# Patient Record
Sex: Male | Born: 1975 | Race: Black or African American | Hispanic: No | Marital: Single | Smoking: Current some day smoker
Health system: Southern US, Community
[De-identification: ages and names within clinical notes are randomized; demographics above are authoritative.]

## PROBLEM LIST (undated history)

## (undated) DIAGNOSIS — I10 Essential (primary) hypertension: Secondary | ICD-10-CM

## (undated) DIAGNOSIS — K429 Umbilical hernia without obstruction or gangrene: Secondary | ICD-10-CM

## (undated) DIAGNOSIS — K219 Gastro-esophageal reflux disease without esophagitis: Secondary | ICD-10-CM

## (undated) DIAGNOSIS — E119 Type 2 diabetes mellitus without complications: Secondary | ICD-10-CM

## (undated) DIAGNOSIS — M722 Plantar fascial fibromatosis: Secondary | ICD-10-CM

## (undated) DIAGNOSIS — M1990 Unspecified osteoarthritis, unspecified site: Secondary | ICD-10-CM

## (undated) HISTORY — PX: TESTICLE SURGERY: SHX794

---

## 2018-06-07 ENCOUNTER — Other Ambulatory Visit: Payer: Self-pay

## 2018-06-07 ENCOUNTER — Emergency Department (HOSPITAL_BASED_OUTPATIENT_CLINIC_OR_DEPARTMENT_OTHER): Payer: BLUE CROSS/BLUE SHIELD

## 2018-06-07 ENCOUNTER — Emergency Department (HOSPITAL_BASED_OUTPATIENT_CLINIC_OR_DEPARTMENT_OTHER)
Admission: EM | Admit: 2018-06-07 | Discharge: 2018-06-07 | Disposition: A | Discharge status: Home / Self Care | Payer: BLUE CROSS/BLUE SHIELD | Attending: Emergency Medicine | Admitting: Emergency Medicine

## 2018-06-07 ENCOUNTER — Encounter (HOSPITAL_BASED_OUTPATIENT_CLINIC_OR_DEPARTMENT_OTHER): Payer: Self-pay | Admitting: Emergency Medicine

## 2018-06-07 DIAGNOSIS — J029 Acute pharyngitis, unspecified: Secondary | ICD-10-CM | POA: Diagnosis present

## 2018-06-07 DIAGNOSIS — F17200 Nicotine dependence, unspecified, uncomplicated: Secondary | ICD-10-CM | POA: Insufficient documentation

## 2018-06-07 DIAGNOSIS — K1120 Sialoadenitis, unspecified: Secondary | ICD-10-CM | POA: Diagnosis not present

## 2018-06-07 LAB — BASIC METABOLIC PANEL
Anion gap: 5 (ref 5–15)
BUN: 13 mg/dL (ref 6–20)
CO2: 25 mmol/L (ref 22–32)
Calcium: 9 mg/dL (ref 8.9–10.3)
Chloride: 106 mmol/L (ref 98–111)
Creatinine, Ser: 0.95 mg/dL (ref 0.61–1.24)
GFR calc Af Amer: >60 mL/min (ref >60)
GFR calc non Af Amer: >60 mL/min (ref >60)
Glucose, Bld: 108 mg/dL — ABNORMAL HIGH (ref 70–99)
Potassium: 4 mmol/L (ref 3.5–5.1)
Sodium: 136 mmol/L (ref 135–145)

## 2018-06-07 LAB — CBC WITH DIFFERENTIAL/PLATELET
Abs Immature Granulocytes: 0.03 10*3/uL (ref 0.00–0.07)
Basophils Absolute: 0 10*3/uL (ref 0.0–0.1)
Basophils Relative: 1 %
Eosinophils Absolute: 0.1 10*3/uL (ref 0.0–0.5)
Eosinophils Relative: 2 %
HCT: 47 % (ref 39.0–52.0)
Hemoglobin: 15.8 g/dL (ref 13.0–17.0)
Immature Granulocytes: 0 %
Lymphocytes Relative: 21 %
Lymphs Abs: 1.7 10*3/uL (ref 0.7–4.0)
MCH: 30 pg (ref 26.0–34.0)
MCHC: 33.6 g/dL (ref 30.0–36.0)
MCV: 89.4 fL (ref 80.0–100.0)
Monocytes Absolute: 0.5 10*3/uL (ref 0.1–1.0)
Monocytes Relative: 6 %
Neutro Abs: 5.7 10*3/uL (ref 1.7–7.7)
Neutrophils Relative %: 70 %
Platelets: 242 10*3/uL (ref 150–400)
RBC: 5.26 MIL/uL (ref 4.22–5.81)
RDW: 11.9 % (ref 11.5–15.5)
WBC: 8.1 10*3/uL (ref 4.0–10.5)
nRBC: 0 % (ref 0.0–0.2)

## 2018-06-07 LAB — GROUP A STREP BY PCR: Group A Strep by PCR: NOT DETECTED

## 2018-06-07 MED ORDER — AMOXICILLIN 500 MG PO CAPS: 500.0000 mg | ORAL_CAPSULE | Freq: Two times a day (BID) | ORAL | 0 refills | Status: AC

## 2018-06-07 MED ORDER — IOHEXOL 300 MG/ML  SOLN
100.0000 mL | Freq: Once | INTRAMUSCULAR | Status: AC | PRN
  Administered 2018-06-07: 14:00:00 75 mL via INTRAVENOUS

## 2018-06-07 NOTE — ED Triage Notes (Signed)
Pt c/o left sided throat pain onset Friday morning. Pt reports that he feels a lump in that area.

## 2018-06-07 NOTE — ED Provider Notes (Signed)
[facility] EMERGENCY DEPARTMENT Provider Note   CSN: [id number] Arrival date & time: 06/07/18  1131  History   Chief Complaint Chief Complaint  Patient presents with   Sore Throat   HPI Benjamin Barton is a 43 y.o. male with no signifiant past medical history who presents for evaluation of neck pain and sore throat. Symptoms began on Friday 2 days PTA.  Taken anything for symptoms.  He rates his pain a 3/10.  Pain does not radiate.  Pain located to left lateral neck.  States he has no pain with swallowing liquids, however states it is painful to swallow solid foods.  Has fever, chills, nausea, vomiting, chest pain, neck stiffness, neck rigidity, injuries or trauma, congestion, rhinorrhea, drooling, dysphasia, trismus.  Denies history of full bolus or esophageal strictures, hx abscesses.  Denies additional aggravating or alleviating factors.  He has not take anything for his pain PTA.  History obtained from patient.  No interpreter was used.     HPI  History reviewed. No pertinent past medical history.  There are no active problems to display for this patient.   Past Surgical History:  Procedure Laterality Date   TESTICLE SURGERY          Home Medications    Prior to Admission medications   Medication Sig Start Date End Date Taking? Authorizing Provider  amoxicillin (AMOXIL) 500 MG capsule Take 1 capsule (500 mg total) by mouth 2 (two) times daily for 5 days. 06/07/18 06/12/18  Yaviel Kloster A, PA-C    Family History No family history on file.  Social History Social History   Tobacco Use   Smoking status: Current Some Day Smoker   Smokeless tobacco: Never Used  Substance Use Topics   Alcohol use: Not Currently   Drug use: Not Currently     Allergies   Patient has no known allergies.   Review of Systems Review of Systems  Constitutional: Negative.   HENT: Positive for sore throat. Negative for congestion, dental problem, drooling, ear  discharge, ear pain, facial swelling, mouth sores, nosebleeds, postnasal drip, rhinorrhea, sinus pressure, sinus pain, sneezing, tinnitus, trouble swallowing and voice change.   Respiratory: Negative.   Cardiovascular: Negative.   Gastrointestinal: Negative.   Genitourinary: Negative.   Musculoskeletal: Positive for neck pain. Negative for arthralgias, back pain, gait problem, joint swelling, myalgias and neck stiffness.  Skin: Negative.   Neurological: Negative.   All other systems reviewed and are negative.  Physical Exam Updated Vital Signs BP 125/85    Pulse 79    Temp 98.3 F (36.8 C)    Resp 18    Ht 6\' 3"  (1.905 m)    Wt 117.9 kg    SpO2 99%    BMI 32.50 kg/m   Physical Exam Vitals signs and nursing note reviewed.  Constitutional:      General: He is not in acute distress.    Appearance: He is well-developed. He is not ill-appearing, toxic-appearing or diaphoretic.  HENT:     Head: Normocephalic and atraumatic.     Nose: Nose normal. No congestion or rhinorrhea.     Mouth/Throat:     Comments: No drooling, dysphasia or trismus.  Sublingual area soft.  No pooling of secretions.  Does have diffuse erythematous posterior oropharynx.  Tonsils without edema or exudate.  No evidence of PTA or RPA. Uvula midline without deviation. Eyes:     Pupils: Pupils are equal, round, and reactive to light.  Neck:  Musculoskeletal: Normal range of motion and neck supple.     Trachea: Phonation normal.      Comments: 1cm non tender nodule to the anterior proximal neck.  No submandibular swelling.  Phonation normal.  No neck stiffness or rigidity. Cardiovascular:     Rate and Rhythm: Normal rate and regular rhythm.     Pulses: Normal pulses.     Heart sounds: Normal heart sounds.  Pulmonary:     Effort: Pulmonary effort is normal. No respiratory distress.     Comments: Clear to auscultation by that wheeze, rhonchi or rales.  No phonation changes. Abdominal:     General: There is no  distension.     Palpations: Abdomen is soft.  Musculoskeletal: Normal range of motion.  Skin:    General: Skin is warm and dry.  Neurological:     Mental Status: He is alert.     ED Treatments / Results  Labs (all labs ordered are listed, but only abnormal results are displayed) Labs Reviewed  BASIC METABOLIC PANEL - Abnormal; Notable for the following components:      Result Value   Glucose, Bld 108 (*)    All other components within normal limits  GROUP A STREP BY PCR  CBC WITH DIFFERENTIAL/PLATELET    EKG None  Radiology Ct Soft Tissue Neck W Contrast  Result Date: 06/07/2018 CLINICAL DATA:  Sore throat, stridor.  Left submandibular pain EXAM: CT NECK WITH CONTRAST TECHNIQUE: Multidetector CT imaging of the neck was performed using the standard protocol following the bolus administration of intravenous contrast. CONTRAST:  60mL OMNIPAQUE IOHEXOL 300 MG/ML  SOLN COMPARISON:  None. FINDINGS: Pharynx and larynx: Normal. No mass or swelling. Salivary glands: 2 mm calculus in the floor of the mouth on the left compatible with a distal left submandibular duct calculus. Minimal ductal dilatation. Left submandibular gland has minimal surrounding edema. No mass lesion. Right submandibular gland normal.  Parotid normal bilaterally. Thyroid: Negative Lymph nodes: 12 mm level 2 lymph nodes bilaterally. Subcentimeter level 5 lymph nodes bilaterally. No pathologic nodes. Vascular: Normal vascular enhancement Limited intracranial: Negative Visualized orbits: Negative Mastoids and visualized paranasal sinuses: Mucous retention cyst left maxillary sinus. Mild mucosal edema right maxillary sinus. No air-fluid levels. Mastoid clear bilaterally. Skeleton: Mild degenerative change cervical spine without acute skeletal abnormality. Upper chest: Negative Other: None IMPRESSION: 2 mm calculus distal left submandibular duct. Mild inflammation left submandibular gland. Negative for epiglottitis or tonsillitis  These results were called by telephone at the time of interpretation on 06/07/2018 at 2:00 pm to Dr. Ralph Leyden , who verbally acknowledged these results. Electronically Signed   By: Marlan Palau M.D.   On: 06/07/2018 14:00    Procedures Procedures (including critical care time)  Medications Ordered in ED Medications  iohexol (OMNIPAQUE) 300 MG/ML solution 100 mL (75 mLs Intravenous Contrast Given 06/07/18 1338)     Initial Impression / Assessment and Plan / ED Course  I have reviewed the triage vital signs and the nursing notes.  Pertinent labs & imaging results that were available during my care of the patient were reviewed by me and considered in my medical decision making (see chart for details).  76 old male appears otherwise well presents for evaluation of neck lesions and sore throat.  Afebrile, nonseptic, non-ill-appearing.  1 cm mobile, nontender area to left anterior lateral neck just below submandibular area without submandibular swelling.  No overlying skin changes specifically no erythema or warmth. No evidence of induration or fluctuance.  No evidence of infectious process on exam.  Posterior oropharynx mildly erythematous without exudate. Uvula midline without deviation.  No drooling, dysphasia or trismus.  Sublingual area soft.  No submandibular swelling.  Low suspicion for Ludwig's angina.  No tenderness to dentition.  No evidence of periapical abscess.  No stridor. Lungs clear.  No tachycardia, tachypnea or hypoxia.  Question possible early deep space infection, RPA, early non-drainable abscess, duct stone or duct infection? Will plan for labs, strep, imaging and reevaluate. Patient does not want anything for pain at this time.  1430: CBC without leukocytosis, Metabolic without electrolyte, renal or liver abnormality, strep negative, CT scan with 2 mm calculus distal left submandibular duct. Mild inflammation left submandibular gland.  Low suspicion clinically for mumps.  Discussed CT with attending. Recommends sour candies and Abx. Patient able to tolerate p.o. intake without difficulty. Patient looks clinically well.  Requesting DC at this time. Patient hemodynamically stable and appropriate for DC at this time.  I discussed return precautions with patient.  Patient voiced understanding and is agreeable to follow-up.      Final Clinical Impressions(s) / ED Diagnoses   Final diagnoses:  Sialadenitis    ED Discharge Orders         Ordered    amoxicillin (AMOXIL) 500 MG capsule  2 times daily     06/07/18 1432           Ranika Mcniel A, PA-C 06/07/18 1530    Tegeler, Canary Brim, MD 06/07/18 [phone]

## 2018-06-07 NOTE — Discharge Instructions (Addendum)
You have a stone in 1 of the ducts in your throat.  I given you an antibiotic.  I would also suggest sour candies to help remove this.  Follow-up with PCP in 2 to 3 days for reevaluation.  If you develop new or worsening symptoms fevers return to the emergency department for evaluation.

## 2020-03-08 IMAGING — CT CT NECK WITH CONTRAST
3 of 4 series · 12 of 33 positions shown, 14 images · IV contrast (omnipaque)
Comparison: None.

CLINICAL DATA: Sore throat, stridor.  Left submandibular pain

EXAM:
CT NECK WITH CONTRAST
TECHNIQUE: Multidetector CT imaging of the neck was performed using the
standard protocol following the bolus administration of intravenous
contrast.
CONTRAST:  75mL OMNIPAQUE IOHEXOL 300 MG/ML  SOLN

[Series 6: sag neck · sagittal · 0.62mm/px · 5 of 127 slices shown, 6 images]
[im 43/127  bone]
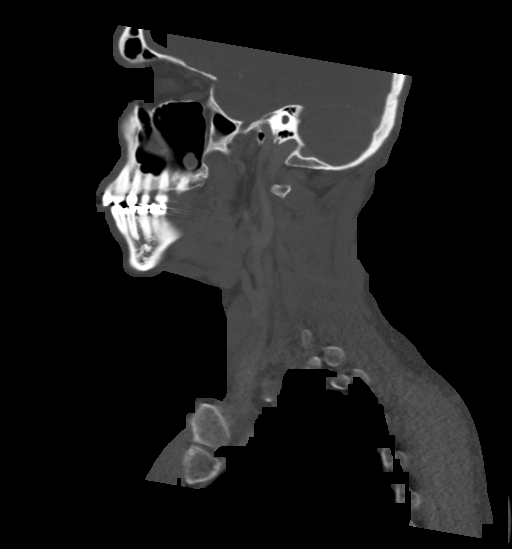
[im 53/127  bone]
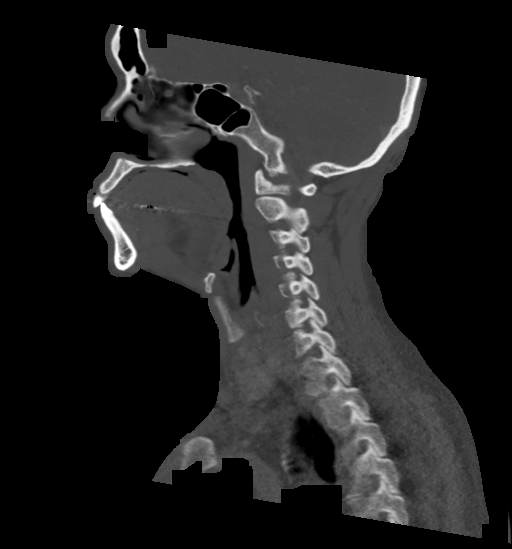
[im 64/127  soft-tissue]
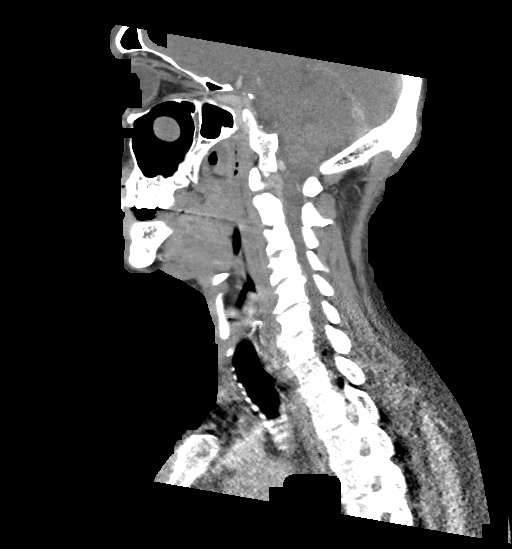
[im 64/127  bone]
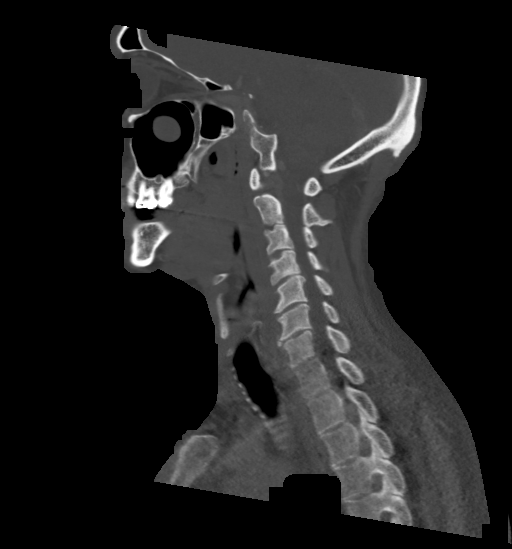
[im 74/127  bone]
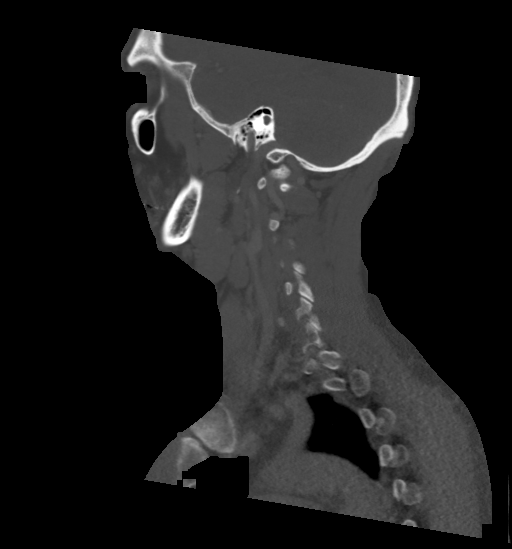
[im 85/127  bone]
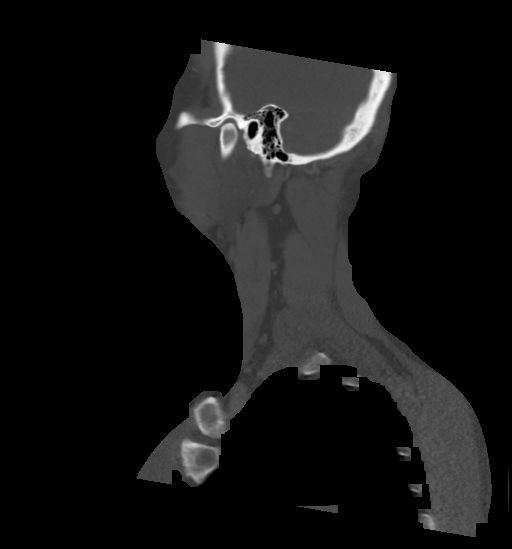

[Series 7: cor neck · coronal · 0.46mm/px · 3 of 115 slices shown]
[im 31/115  bone]
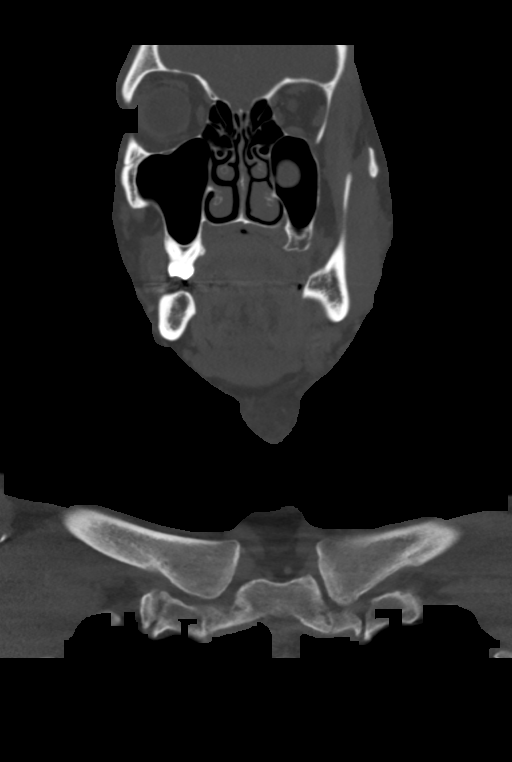
[im 49/115  bone]
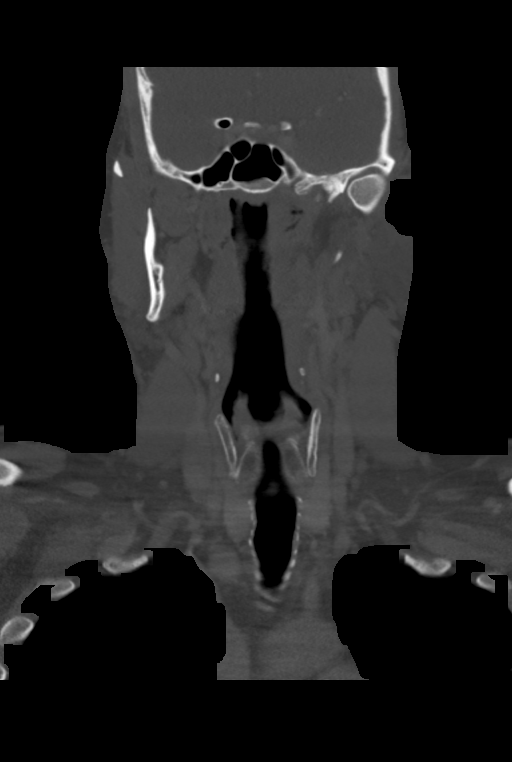
[im 67/115  bone]
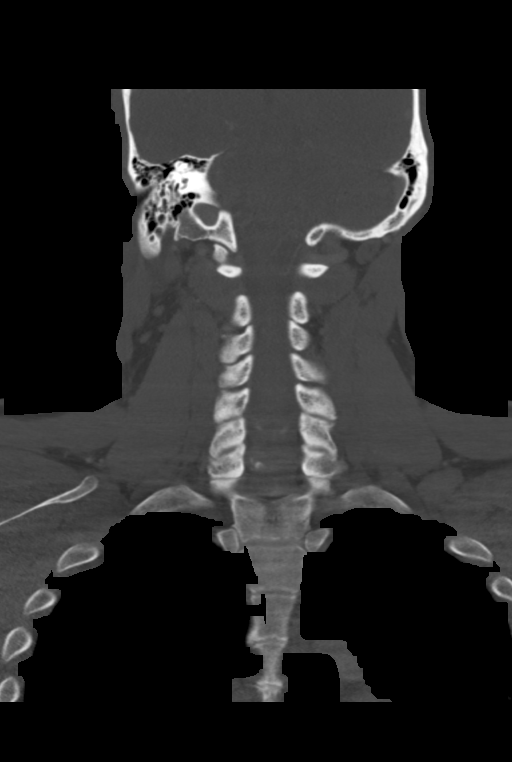

[Series 8: orthogonal ax · axial · 0.39mm/px · z∈[+614,+835]mm · 4 of 164 slices shown, 5 images]
[im 24/164  soft-tissue]
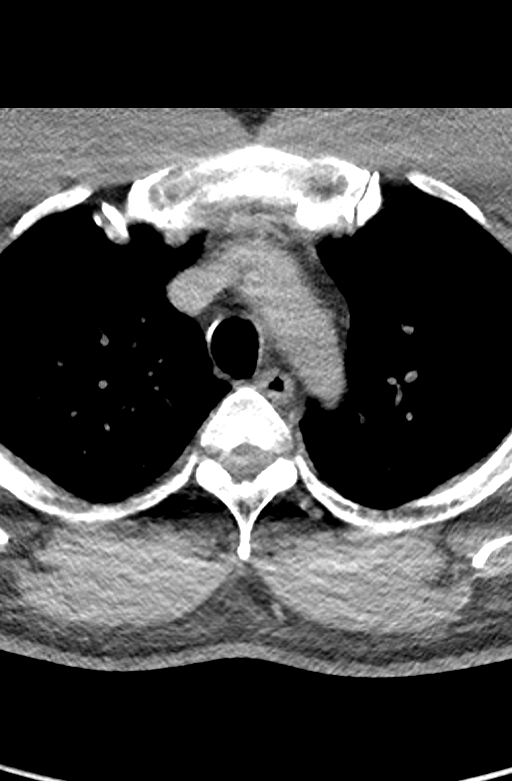
[im 24/164  bone]
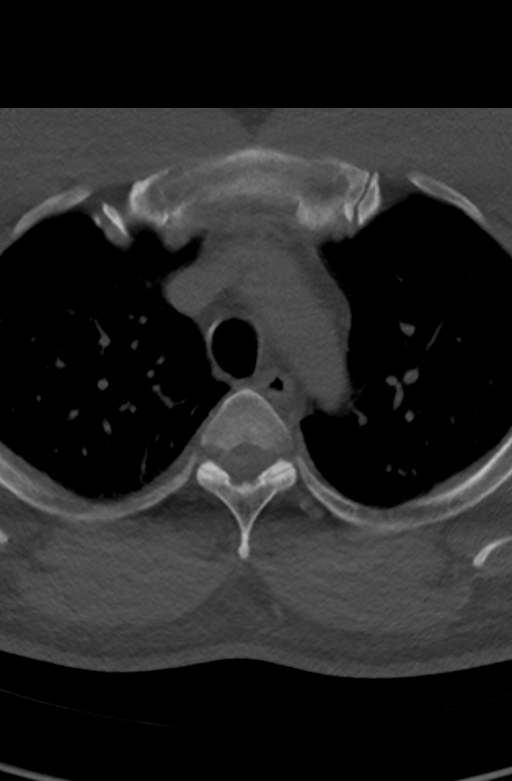
[im 70/164  bone]
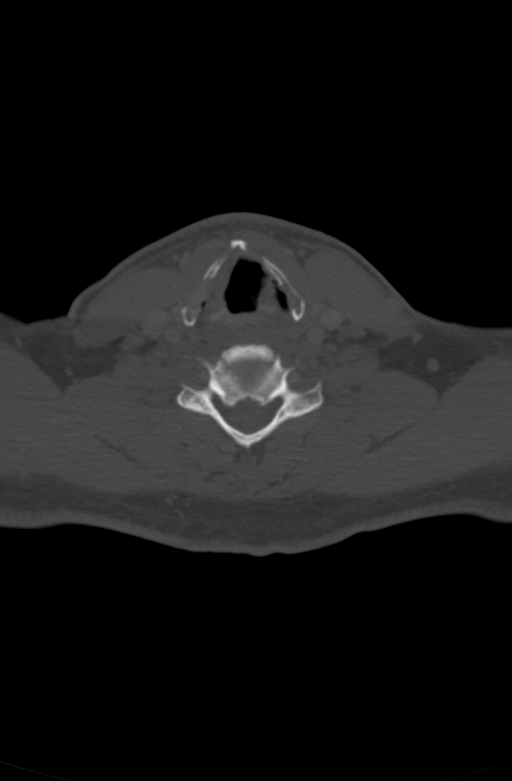
[im 94/164  bone]
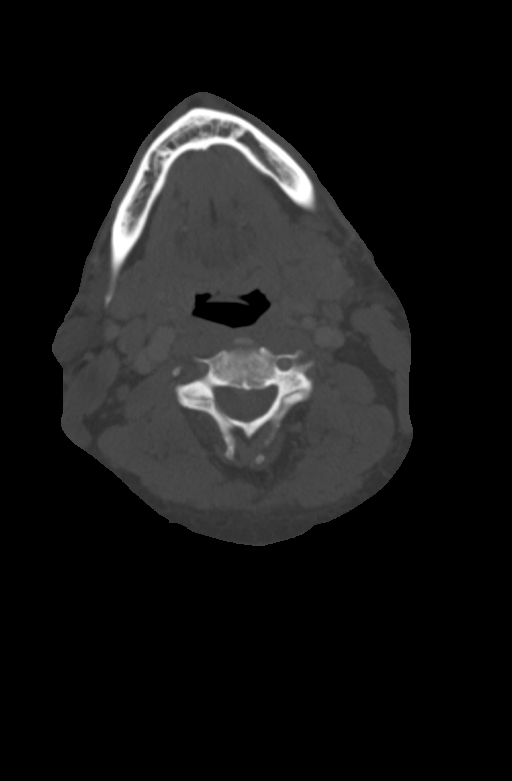
[im 140/164  bone]
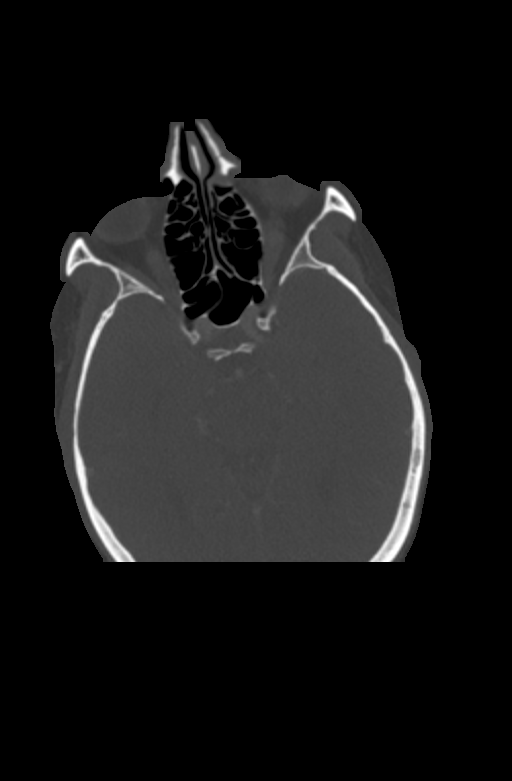

[12 of 33 positions shown; findings below may reference images not displayed]

FINDINGS: Pharynx and larynx: Normal. No mass or swelling.

Salivary glands: 2 mm calculus in the floor of the mouth on the left
compatible with a distal left submandibular duct calculus. Minimal
ductal dilatation. Left submandibular gland has minimal surrounding
edema. No mass lesion.

Right submandibular gland normal.  Parotid normal bilaterally.

Thyroid: Negative

Lymph nodes: 12 mm level 2 lymph nodes bilaterally. Subcentimeter
level 5 lymph nodes bilaterally. No pathologic nodes.

Vascular: Normal vascular enhancement

Limited intracranial: Negative

Visualized orbits: Negative

Mastoids and visualized paranasal sinuses: Mucous retention cyst
left maxillary sinus. Mild mucosal edema right maxillary sinus. No
air-fluid levels. Mastoid clear bilaterally.

Skeleton: Mild degenerative change cervical spine without acute
skeletal abnormality.

Upper chest: Negative

Other: None
IMPRESSION: 2 mm calculus distal left submandibular duct. Mild inflammation left
submandibular gland.

Negative for epiglottitis or tonsillitis

These results were called by telephone at the time of interpretation
on 06/07/2018 at [DATE] to Dr. SALLIE NORTHROP , who verbally
acknowledged these results.

## 2021-07-13 ENCOUNTER — Other Ambulatory Visit: Payer: Self-pay

## 2021-07-13 ENCOUNTER — Emergency Department (HOSPITAL_BASED_OUTPATIENT_CLINIC_OR_DEPARTMENT_OTHER)
Admission: EM | Admit: 2021-07-13 | Discharge: 2021-07-13 | Disposition: A | Discharge status: Home / Self Care | Payer: BC Managed Care – PPO | Attending: Emergency Medicine | Admitting: Emergency Medicine

## 2021-07-13 ENCOUNTER — Encounter (HOSPITAL_BASED_OUTPATIENT_CLINIC_OR_DEPARTMENT_OTHER): Payer: Self-pay

## 2021-07-13 DIAGNOSIS — M25561 Pain in right knee: Secondary | ICD-10-CM | POA: Diagnosis present

## 2021-07-13 HISTORY — DX: Plantar fascial fibromatosis: M72.2

## 2021-07-13 MED ORDER — KETOROLAC TROMETHAMINE 15 MG/ML IJ SOLN
15.0000 mg | Freq: Once | INTRAMUSCULAR | Status: AC
  Administered 2021-07-13: 15 mg via INTRAMUSCULAR
  Filled 2021-07-13: qty 1

## 2021-07-13 NOTE — Discharge Instructions (Signed)
Take 4 over the counter ibuprofen tablets 3 times a day or 2 over-the-counter naproxen tablets twice a day for pain. Also take tylenol 1000mg (2 extra strength) four times a day.    Follow-up with your family doctor in the office.  Hopefully after you have not bared weight on it for about a week it significantly better.  If not change then they may need to refer you to a specialist or obtain imaging.

## 2021-07-13 NOTE — ED Triage Notes (Addendum)
Pt reports right knee pain and swelling. Pain shoots down calf of leg No injury reported

## 2021-07-13 NOTE — ED Provider Notes (Signed)
[facility] EMERGENCY DEPARTMENT Provider Note   CSN: [id number] Arrival date & time: 07/13/21  0840     History  Chief Complaint  Patient presents with   Knee Pain    Benjamin Barton is a 46 y.o. male.  46 yo M with a chief complaint of right knee pain.  This been going on for about 3 to 4 days.  Denies obvious injury.  Feels like is a bit swollen.  Hurts along the tibia on the medial aspect just below the knee.  Denies fevers or chills.  Denies history of injury to that knee.   Knee Pain     Home Medications Prior to Admission medications   Not on File      Allergies    Patient has no known allergies.    Review of Systems   Review of Systems  Physical Exam Updated Vital Signs BP 121/83   Pulse 88   Temp 99.7 F (37.6 C)   Resp 18   Ht 6\' 3"  (1.905 m)   Wt 120.2 kg   SpO2 96%   BMI 33.12 kg/m  Physical Exam Vitals and nursing note reviewed.  Constitutional:      Appearance: He is well-developed.  HENT:     Head: Normocephalic and atraumatic.  Eyes:     Pupils: Pupils are equal, round, and reactive to light.  Neck:     Vascular: No JVD.  Cardiovascular:     Rate and Rhythm: Normal rate and regular rhythm.     Heart sounds: No murmur heard.   No friction rub. No gallop.  Pulmonary:     Effort: No respiratory distress.     Breath sounds: No wheezing.  Abdominal:     General: There is no distension.     Tenderness: There is no abdominal tenderness. There is no guarding or rebound.  Musculoskeletal:        General: Normal range of motion.     Cervical back: Normal range of motion and neck supple.     Comments: 2 old scars to the knee.  No obvious effusion.  Ligaments appear to be intact.  McMurray's test negative.  Pain mostly at the medial attachment of the hamstring to the tibia.  Pulse motor and sensation intact distally.  Skin:    Coloration: Skin is not pale.     Findings: No rash.  Neurological:     Mental Status: He is alert  and oriented to person, place, and time.  Psychiatric:        Behavior: Behavior normal.    ED Results / Procedures / Treatments   Labs (all labs ordered are listed, but only abnormal results are displayed) Labs Reviewed - No data to display  EKG None  Radiology No results found.  Procedures Procedures    Medications Ordered in ED Medications  ketorolac (TORADOL) 15 MG/ML injection 15 mg (15 mg Intramuscular Given 07/13/21 07/15/21)    ED Course/ Medical Decision Making/ A&P                           Medical Decision Making Risk Prescription drug management.   46 yo M with a chief complaints of right knee pain.  Going on for about 4 days now.  No obvious injury.  No erythema warmth or edema.  Pain mostly at the attachment of the hamstring to the tibia.  Suspect hamstring strain.  Will immobilize.  PCP follow-up.  9:20  AM:  I have discussed the diagnosis/risks/treatment options with the patient.  Evaluation and diagnostic testing in the emergency department does not suggest an emergent condition requiring admission or immediate intervention beyond what has been performed at this time.  They will follow up with  PCP. We also discussed returning to the ED immediately if new or worsening sx occur. We discussed the sx which are most concerning (e.g., sudden worsening pain, fever, inability to tolerate by mouth) that necessitate immediate return. Medications administered to the patient during their visit and any new prescriptions provided to the patient are listed below.  Medications given during this visit Medications  ketorolac (TORADOL) 15 MG/ML injection 15 mg (15 mg Intramuscular Given 07/13/21 4081)     The patient appears reasonably screen and/or stabilized for discharge and I doubt any other medical condition or other [facility] requiring further screening, evaluation, or treatment in the ED at this time prior to discharge.          Final Clinical Impression(s) / ED  Diagnoses Final diagnoses:  Acute pain of right knee    Rx / DC Orders ED Discharge Orders     None         Melene Plan, DO 07/13/21 0920

## 2022-03-12 ENCOUNTER — Emergency Department (HOSPITAL_BASED_OUTPATIENT_CLINIC_OR_DEPARTMENT_OTHER)
Admission: EM | Admit: 2022-03-12 | Discharge: 2022-03-12 | Disposition: A | Discharge status: Home / Self Care | Payer: BC Managed Care – PPO | Attending: Emergency Medicine | Admitting: Emergency Medicine

## 2022-03-12 ENCOUNTER — Other Ambulatory Visit: Payer: Self-pay

## 2022-03-12 ENCOUNTER — Encounter (HOSPITAL_BASED_OUTPATIENT_CLINIC_OR_DEPARTMENT_OTHER): Payer: Self-pay

## 2022-03-12 DIAGNOSIS — R0981 Nasal congestion: Secondary | ICD-10-CM | POA: Diagnosis present

## 2022-03-12 DIAGNOSIS — J029 Acute pharyngitis, unspecified: Secondary | ICD-10-CM | POA: Insufficient documentation

## 2022-03-12 DIAGNOSIS — Z1152 Encounter for screening for COVID-19: Secondary | ICD-10-CM | POA: Insufficient documentation

## 2022-03-12 LAB — GROUP A STREP BY PCR: Group A Strep by PCR: NOT DETECTED

## 2022-03-12 LAB — RESP PANEL BY RT-PCR (RSV, FLU A&B, COVID)  RVPGX2
Influenza A by PCR: NEGATIVE
Influenza B by PCR: NEGATIVE
Resp Syncytial Virus by PCR: NEGATIVE
SARS Coronavirus 2 by RT PCR: NEGATIVE

## 2022-03-12 MED ORDER — LIDOCAINE VISCOUS HCL 2 % MT SOLN: 15.0000 mL | Freq: Once | OROMUCOSAL | Status: DC

## 2022-03-12 NOTE — Discharge Instructions (Addendum)
Evaluation for sore throat revealed that you likely do not have strep throat.  Recommend that he continue conservative treatment at home.  If your symptoms worsen please follow-up with your PCP.

## 2022-03-12 NOTE — ED Provider Notes (Signed)
  [facility] EMERGENCY DEPARTMENT Provider Note   CSN: [id number] Arrival date & time: 03/12/22  1048     History  Chief Complaint  Patient presents with   Sore Throat   HPI Benjamin Barton is a 47 y.o. male presenting for sore throat.  States he has had sore throat on and off again for about a month.  Also states that multiple members of his family have been diagnosed and treated for strep throat in the last month.  Also endorses cough and congestion but no fever.  Denies trouble swallowing, breathing or drooling.  Denies neck pain.    Sore Throat       Home Medications Prior to Admission medications   Not on File      Allergies    Patient has no known allergies.    Review of Systems   Review of Systems  HENT:  Positive for sore throat.     Physical Exam   Vitals:   03/12/22 1100  BP: 135/88  Pulse: 88  Resp: 16  Temp: 98.1 F (36.7 C)  SpO2: 98%    CONSTITUTIONAL:  well-appearing, NAD NEURO:  Alert and oriented x 3, CN 3-12 grossly intact EYES:  eyes equal and reactive ENT/NECK:  Supple, no stridor, posterior pharynx: Mild erythema, no swelling no exudate.  No evidence of peritonsillar abscess.  No cervical lymphadenopathy. CARDIO:  regular rate and rhythm, appears well-perfused  PULM:  No respiratory distress, CTAB MSK/SPINE:  No gross deformities, no edema, moves all extremities  SKIN:  no rash, atraumatic   *Additional and/or pertinent findings included in MDM below    ED Results / Procedures / Treatments   Labs (all labs ordered are listed, but only abnormal results are displayed) Labs Reviewed  GROUP A STREP BY PCR  RESP PANEL BY RT-PCR (RSV, FLU A&B, COVID)  [id number]    EKG None  Radiology No results found.  Procedures Procedures    Medications Ordered in ED Medications  lidocaine (XYLOCAINE) 2 % viscous mouth solution 15 mL (has no administration in time range)    ED Course/ Medical Decision Making/ A&P                              Medical Decision Making Risk Prescription drug management.   48 year old male who is well-appearing and nontoxic presenting for sore throat and congestion.  Examination of his throat was overall unremarkable did reveal some mild erythema but otherwise no evidence of active strep pharyngitis or peritonsillar abscess.  Treated his symptoms with viscous lidocaine.  Ordered respiratory PCR.  Patient expressed desire to be discharged and follow-up with his results on MyChart.  I felt this was appropriate given how clinically well he appears at this time.        Final Clinical Impression(s) / ED Diagnoses Final diagnoses:  Sore throat    Rx / DC Orders ED Discharge Orders     None         Harriet Pho, PA-C 03/12/22 1327    Regan Lemming, MD 03/13/22 [phone]

## 2022-03-12 NOTE — ED Triage Notes (Signed)
Pt c/o sore throat x1 month.  Pt reports "it went away and came back."  Pain score 4/10.  Pt reports his entire family has had strep.

## 2022-04-15 ENCOUNTER — Ambulatory Visit: Payer: Self-pay | Admitting: Student

## 2022-04-15 DIAGNOSIS — E119 Type 2 diabetes mellitus without complications: Secondary | ICD-10-CM

## 2022-04-17 NOTE — Progress Notes (Signed)
COVID Vaccine received:  []$  No [x]$  Yes Date of any COVID positive Test in last 90 days:  none  PCP - Saundra Shelling,  PA-C at [place] [phone] Cardiologist - none  Chest x-ray -  EKG -  will do at PST  Stress Test -  ECHO -  Cardiac Cath -   PCR screen: [x]$  Ordered & Completed                      []$   No Order but Needs PROFEND                      []$   N/A for this surgery  Surgery Plan:  [x]$  Ambulatory                            []$  Outpatient in bed                            []$  Admit  Anesthesia:    []$  General  [x]$  Spinal                           []$   Choice []$   MAC  Pacemaker / ICD device [x]$  No []$  Yes        Device order form faxed [x]$  No    []$   Yes      Faxed to:  Spinal Cord Stimulator:[x]$  No []$  Yes      (Remind patient to bring remote DOS) Other Implants:   History of Sleep Apnea? [x]$  No []$  Yes   CPAP used?- [x]$  No []$  Yes    Does the patient monitor blood sugar? [x]$  No []$  Yes  []$  N/A Last A1c: 6.8 on 03-29-22 at PCP Patient has: []$  Pre-DM   []$  DM1  [x]$   DM2 Does patient have a [employer] or Dexacom? [x]$  No []$  Yes   Fasting Blood Sugar Ranges-  Checks Blood Sugar _0_ times a day Metformin 571m bid-   Blood Thinner / Instructions: none Aspirin Instructions:  none  ERAS Protocol Ordered: []$  No  [x]$  Yes PRE-SURGERY []$  ENSURE  [x]$  G2  Patient is to be NPO after: 1100 am  Activity level: Patient can climb a flight of stairs without difficulty; [x]$  No CP  [x]$  No SOB, but would have leg pain. Patient can perform ADLs without assistance.   Anesthesia review: DM2, HTN, Smokes,   Patient denies shortness of breath, fever, cough and chest pain at PAT appointment.  Patient verbalized understanding and agreement to the Pre-Surgical Instructions that were given to them at this PAT appointment. Patient was also educated of the need to review these PAT instructions again prior to his/her surgery.I reviewed the appropriate phone numbers to call if  they have any and questions or concerns.

## 2022-04-17 NOTE — Patient Instructions (Signed)
SURGICAL WAITING ROOM VISITATION Patients having surgery or a procedure may have no more than 2 support people in the waiting area - these visitors may rotate in the visitor waiting room.   Due to an increase in RSV and influenza rates and associated hospitalizations, children ages 65 and under may not visit patients in [place]. If the patient needs to stay at the hospital during part of their recovery, the visitor guidelines for inpatient rooms apply.  PRE-OP VISITATION  Pre-op nurse will coordinate an appropriate time for 1 support person to accompany the patient in pre-op.  This support person may not rotate.  This visitor will be contacted when the time is appropriate for the visitor to come back in the pre-op area.  Please refer to the [facility] website for the visitor guidelines for Inpatients (after your surgery is over and you are in a regular room).  You are not required to quarantine at this time prior to your surgery. However, you must do this: Hand Hygiene often Do NOT share personal items Notify your provider if you are in close contact with someone who has COVID or you develop fever 100.4 or greater, new onset of sneezing, cough, sore throat, shortness of breath or body aches.  If you test positive for Covid or have been in contact with anyone that has tested positive in the last 10 days please notify you surgeon.    Your procedure is scheduled on:  Wednesday  May 01, 2022   Report to [facility] Main Entrance: [address] entrance where the [employer] is available.   Report to admitting at: 11:30    AM  +++++Call this number if you have any questions or problems the morning of surgery [phone]  Do not eat food after Midnight the night prior to your surgery/procedure.  After Midnight you may have the following liquids until  11:00 AM DAY OF SURGERY  Clear Liquid Diet Water Black Coffee (sugar ok, NO MILK/CREAM OR CREAMERS)  Tea (sugar ok, NO  MILK/CREAM OR CREAMERS) regular and decaf                             Plain Jell-O  with no fruit (NO RED)                                           Fruit ices (not with fruit pulp, NO RED)                                     Popsicles (NO RED)                                                                  Juice: apple, WHITE grape, WHITE cranberry Sports drinks like Gatorade or Powerade (NO RED)                    The day of surgery:  Drink ONE (1) Pre-Surgery G2 at   11:00 AM the morning of surgery. Drink in one  sitting. Do not sip.  This drink was given to you during your hospital pre-op appointment visit. Nothing else to drink after completing the Pre-Surgery G2 : No candy, chewing gum or throat lozenges.    FOLLOW ANY ADDITIONAL PRE OP INSTRUCTIONS YOU RECEIVED FROM YOUR SURGEON'S OFFICE!!!   Oral Hygiene is also important to reduce your risk of infection.        Remember - BRUSH YOUR TEETH THE MORNING OF SURGERY WITH YOUR REGULAR TOOTHPASTE  Do NOT smoke after Midnight the night before surgery.  Take ONLY these medicines the morning of surgery with A SIP OF WATER: none   If You have been diagnosed with Sleep Apnea - Bring CPAP mask and tubing day of surgery. We will provide you with a CPAP machine on the day of your surgery.                   You may not have any metal on your body including  jewelry, and body piercing  Do not wear lotions, powders,cologne, or deodorant  Men may shave face and neck.  Contacts, Hearing Aids, dentures or bridgework may not be worn into surgery. DENTURES WILL BE REMOVED PRIOR TO SURGERY PLEASE DO NOT APPLY "Poly grip" OR ADHESIVES!!!  Patients discharged on the day of surgery will not be allowed to drive home.  Someone NEEDS to stay with you for the first 24 hours after anesthesia.  Do not bring your home medications to the hospital. The Pharmacy will dispense medications listed on your medication list to you during your admission in the  Hospital.  Special Instructions: Bring a copy of your healthcare power of attorney and living will documents the day of surgery, if you wish to have them scanned into your [facility] Records- EPIC  Please read over the following fact sheets you were given: IF YOU HAVE QUESTIONS ABOUT YOUR PRE-OP INSTRUCTIONS, PLEASE CALL [phone]  ([place])   [facility] - Preparing for Surgery Before surgery, you can play an important role.  Because skin is not sterile, your skin needs to be as free of germs as possible.  You can reduce the number of germs on your skin by washing with CHG (chlorahexidine gluconate) soap before surgery.  CHG is an antiseptic cleaner which kills germs and bonds with the skin to continue killing germs even after washing. Please DO NOT use if you have an allergy to CHG or antibacterial soaps.  If your skin becomes reddened/irritated stop using the CHG and inform your nurse when you arrive at Short Stay. Do not shave (including legs and underarms) for at least 48 hours prior to the first CHG shower.  You may shave your face/neck.  Please follow these instructions carefully:  1.  Shower with CHG Soap the night before surgery and the  morning of surgery.  2.  If you choose to wash your hair, wash your hair first as usual with your normal  shampoo.  3.  After you shampoo, rinse your hair and body thoroughly to remove the shampoo.                             4.  Use CHG as you would any other liquid soap.  You can apply chg directly to the skin and wash.  Gently with a scrungie or clean washcloth.  5.  Apply the CHG Soap to your body ONLY FROM THE NECK DOWN.   Do not  use on face/ open                           Wound or open sores. Avoid contact with eyes, ears mouth and genitals (private parts).                       Wash face,  Genitals (private parts) with your normal soap.             6.  Wash thoroughly, paying special attention to the area where your  surgery  will be  performed.  7.  Thoroughly rinse your body with warm water from the neck down.  8.  DO NOT shower/wash with your normal soap after using and rinsing off the CHG Soap.            9.  Pat yourself dry with a clean towel.            10.  Wear clean pajamas.            11.  Place clean sheets on your bed the night of your first shower and do not  sleep with pets.  ON THE DAY OF SURGERY : Do not apply any lotions/deodorants the morning of surgery.  Please wear clean clothes to the hospital/surgery center.    FAILURE TO FOLLOW THESE INSTRUCTIONS MAY RESULT IN THE CANCELLATION OF YOUR SURGERY  PATIENT SIGNATURE_________________________________  NURSE SIGNATURE__________________________________  ________________________________________________________________________         Benjamin Barton    An incentive spirometer is a tool that can help keep your lungs clear and active. This tool measures how well you are filling your lungs with each breath. Taking long deep breaths may help reverse or decrease the chance of developing breathing (pulmonary) problems (especially infection) following: A long period of time when you are unable to move or be active. BEFORE THE PROCEDURE  If the spirometer includes an indicator to show your best effort, your nurse or respiratory therapist will set it to a desired goal. If possible, sit up straight or lean slightly forward. Try not to slouch. Hold the incentive spirometer in an upright position. INSTRUCTIONS FOR USE  Sit on the edge of your bed if possible, or sit up as far as you can in bed or on a chair. Hold the incentive spirometer in an upright position. Breathe out normally. Place the mouthpiece in your mouth and seal your lips tightly around it. Breathe in slowly and as deeply as possible, raising the piston or the ball toward the top of the column. Hold your breath for 3-5 seconds or for as long as possible. Allow the piston or ball to  fall to the bottom of the column. Remove the mouthpiece from your mouth and breathe out normally. Rest for a few seconds and repeat Steps 1 through 7 at least 10 times every 1-2 hours when you are awake. Take your time and take a few normal breaths between deep breaths. The spirometer may include an indicator to show your best effort. Use the indicator as a goal to work toward during each repetition. After each set of 10 deep breaths, practice coughing to be sure your lungs are clear. If you have an incision (the cut made at the time of surgery), support your incision when coughing by placing a pillow or rolled up towels firmly against it. Once you are able to get out of bed, walk around indoors and  cough well. You may stop using the incentive spirometer when instructed by your caregiver.  RISKS AND COMPLICATIONS Take your time so you do not get dizzy or light-headed. If you are in pain, you may need to take or ask for pain medication before doing incentive spirometry. It is harder to take a deep breath if you are having pain. AFTER USE Rest and breathe slowly and easily. It can be helpful to keep track of a log of your progress. Your caregiver can provide you with a simple table to help with this. If you are using the spirometer at home, follow these instructions: [place] IF:  You are having difficultly using the spirometer. You have trouble using the spirometer as often as instructed. Your pain medication is not giving enough relief while using the spirometer. You develop fever of 100.5 F (38.1 C) or higher.                                                                                                    SEEK IMMEDIATE MEDICAL CARE IF:  You cough up bloody sputum that had not been present before. You develop fever of 102 F (38.9 C) or greater. You develop worsening pain at or near the incision site. MAKE SURE YOU:  Understand these instructions. Will watch your  condition. Will get help right away if you are not doing well or get worse. Document Released: 06/24/2006 Document Revised: 05/06/2011 Document Reviewed: 08/25/2006 [facility] Patient Information 2014 [place], [place].

## 2022-04-18 ENCOUNTER — Encounter (HOSPITAL_COMMUNITY)
Admission: RE | Admit: 2022-04-18 | Discharge: 2022-04-18 | Disposition: A | Discharge status: Home / Self Care | Payer: BC Managed Care – PPO | Source: Ambulatory Visit | Attending: Orthopedic Surgery | Admitting: Orthopedic Surgery

## 2022-04-18 ENCOUNTER — Encounter (HOSPITAL_COMMUNITY): Payer: Self-pay

## 2022-04-18 ENCOUNTER — Other Ambulatory Visit: Payer: Self-pay

## 2022-04-18 VITALS — BP 139/83 | HR 98 | Temp 98.4°F | Resp 20 | Ht 75.0 in | Wt 270.0 lb

## 2022-04-18 DIAGNOSIS — Z01818 Encounter for other preprocedural examination: Secondary | ICD-10-CM

## 2022-04-18 DIAGNOSIS — E119 Type 2 diabetes mellitus without complications: Secondary | ICD-10-CM | POA: Insufficient documentation

## 2022-04-18 DIAGNOSIS — I10 Essential (primary) hypertension: Secondary | ICD-10-CM | POA: Insufficient documentation

## 2022-04-18 HISTORY — DX: Unspecified osteoarthritis, unspecified site: M19.90

## 2022-04-18 HISTORY — DX: Umbilical hernia without obstruction or gangrene: K42.9

## 2022-04-18 HISTORY — DX: Gastro-esophageal reflux disease without esophagitis: K21.9

## 2022-04-18 HISTORY — DX: Essential (primary) hypertension: I10

## 2022-04-18 LAB — SURGICAL PCR SCREEN
MRSA, PCR: NEGATIVE
Staphylococcus aureus: NEGATIVE

## 2022-04-18 LAB — CBC
HCT: 45 % (ref 39.0–52.0)
Hemoglobin: 15.7 g/dL (ref 13.0–17.0)
MCH: 30.4 pg (ref 26.0–34.0)
MCHC: 34.9 g/dL (ref 30.0–36.0)
MCV: 87 fL (ref 80.0–100.0)
Platelets: 245 10*3/uL (ref 150–400)
RBC: 5.17 MIL/uL (ref 4.22–5.81)
RDW: 11.8 % (ref 11.5–15.5)
WBC: 9.5 10*3/uL (ref 4.0–10.5)
nRBC: 0 % (ref 0.0–0.2)

## 2022-04-18 LAB — BASIC METABOLIC PANEL
Anion gap: 11 (ref 5–15)
BUN: 18 mg/dL (ref 6–20)
CO2: 23 mmol/L (ref 22–32)
Calcium: 9 mg/dL (ref 8.9–10.3)
Chloride: 104 mmol/L (ref 98–111)
Creatinine, Ser: 0.98 mg/dL (ref 0.61–1.24)
GFR, Estimated: >60 mL/min (ref >60)
Glucose, Bld: 136 mg/dL — ABNORMAL HIGH (ref 70–99)
Potassium: 3.7 mmol/L (ref 3.5–5.1)
Sodium: 138 mmol/L (ref 135–145)

## 2022-04-18 LAB — GLUCOSE, CAPILLARY: Glucose-Capillary: 145 mg/dL — ABNORMAL HIGH (ref 70–99)

## 2022-04-24 ENCOUNTER — Ambulatory Visit: Payer: Self-pay | Admitting: Student

## 2022-04-24 NOTE — H&P (Signed)
TOTAL KNEE ADMISSION H&P  Patient is being admitted for right total knee arthroplasty.  Subjective:  Chief Complaint:right knee pain.  HPI: Benjamin Barton, 47 y.o. male, has a history of pain and functional disability in the right knee due to arthritis and has failed non-surgical conservative treatments for greater than 12 weeks to includeNSAID's and/or analgesics, corticosteriod injections, flexibility and strengthening excercises, use of assistive devices, and activity modification.  Onset of symptoms was gradual, starting 3 years ago with rapidlly worsening course since that time. The patient noted no past surgery on the right knee(s).  Patient currently rates pain in the right knee(s) at 10 out of 10 with activity. Patient has night pain, worsening of pain with activity and weight bearing, pain that interferes with activities of daily living, pain with passive range of motion, crepitus, and joint swelling.  Patient has evidence of subchondral cysts, subchondral sclerosis, periarticular osteophytes, and joint space narrowing by imaging studies. There is no active infection.  There are no problems to display for this patient.  Past Medical History:  Diagnosis Date   Arthritis    GERD (gastroesophageal reflux disease)    Hypertension    Plantar fasciitis    both feet   Umbilical hernia    small, no surgery    Past Surgical History:  Procedure Laterality Date   TESTICLE SURGERY     undescended testicle    Current Outpatient Medications  Medication Sig Dispense Refill Last Dose   atorvastatin (LIPITOR) 20 MG tablet Take 20 mg by mouth every evening.      metFORMIN (GLUCOPHAGE) 500 MG tablet Take 500 mg by mouth 2 (two) times daily with a meal.      No current facility-administered medications for this visit.   No Known Allergies  Social History   Tobacco Use   Smoking status: Some Days    Types: Cigarettes   Smokeless tobacco: Never  Substance Use Topics   Alcohol use:  Not Currently    No family history on file.   Review of Systems  Musculoskeletal:  Positive for arthralgias, gait problem and joint swelling.  All other systems reviewed and are negative.   Objective:  Physical Exam Constitutional:      Appearance: Normal appearance.  HENT:     Head: Normocephalic and atraumatic.     Nose: Nose normal.     Mouth/Throat:     Mouth: Mucous membranes are moist.     Pharynx: Oropharynx is clear.  Eyes:     Conjunctiva/sclera: Conjunctivae normal.  Cardiovascular:     Rate and Rhythm: Normal rate and regular rhythm.     Pulses: Normal pulses.     Heart sounds: Normal heart sounds.  Pulmonary:     Effort: Pulmonary effort is normal.     Breath sounds: Normal breath sounds.  Abdominal:     General: Abdomen is flat.     Palpations: Abdomen is soft.  Genitourinary:    Comments: deferred Musculoskeletal:     Cervical back: Normal range of motion and neck supple.     Comments: Examination of the right knee reveals no skin wounds or lesions. He has swelling and an effusion. No warmth or erythema. He has exquisite tenderness to palpation over the medial joint line. His range of motion is 15 to 95 degrees without any ligamentous instability. No extensor lag.  Distally, there is no focal motor or sensory deficit. He has palpable pedal pulses.  He ambulates with a severely antalgic gait.  Skin:  General: Skin is warm and dry.     Capillary Refill: Capillary refill takes less than 2 seconds.  Neurological:     General: No focal deficit present.     Mental Status: He is alert and oriented to person, place, and time.  Psychiatric:        Mood and Affect: Mood normal.        Behavior: Behavior normal.        Thought Content: Thought content normal.        Judgment: Judgment normal.     Vital signs in last 24 hours: '@VSRANGES'$ @  Labs:   Estimated body mass index is 33.75 kg/m as calculated from the following:   Height as of 04/18/22: '6\' 3"'$   (1.905 m).   Weight as of 04/18/22: 122.5 kg.   Imaging Review Plain radiographs demonstrate severe degenerative joint disease of the right knee(s). The overall alignment issignificant varus. The bone quality appears to be adequate for age and reported activity level.      Assessment/Plan:  End stage arthritis, right knee   The patient history, physical examination, clinical judgment of the provider and imaging studies are consistent with end stage degenerative joint disease of the right knee(s) and total knee arthroplasty is deemed medically necessary. The treatment options including medical management, injection therapy arthroscopy and arthroplasty were discussed at length. The risks and benefits of total knee arthroplasty were presented and reviewed. The risks due to aseptic loosening, infection, stiffness, patella tracking problems, thromboembolic complications and other imponderables were discussed. The patient acknowledged the explanation, agreed to proceed with the plan and consent was signed. Patient is being admitted for inpatient treatment for surgery, pain control, PT, OT, prophylactic antibiotics, VTE prophylaxis, progressive ambulation and ADL's and discharge planning. The patient is planning to be discharged home with OPPT.   Therapy Plans: outpatient therapy. 1st PT pre-eval 04/23/22. Might want to do [facility] in [facility] 05/06/22. Printed Rx and gave to patient.  Disposition: Home with wife Planned DVT Prophylaxis: aspirin '81mg'$  BID DME needed: walker. Will consider iceman.  PCP: Cleared TXA: IV Allergies: NDKA.  Anesthesia Concerns: None.  BMI: 33.3 Last HgbA1c: 6.8 Other: - T2DM, Metformin.  - Oxycodone, zofran, methocarbamol, has diclofenac. - 04/18/22: Hgb 15.7, Cr. 0.98, K+ 3.7    Patient's anticipated LOS is less than 2 midnights, meeting these requirements: - Younger than 10 - Lives within 1 hour of care - Has a competent adult at home to recover with  post-op recover - NO history of  - Chronic pain requiring opiods  - Diabetes  - Coronary Artery Disease  - Heart failure  - Heart attack  - Stroke  - DVT/VTE  - Cardiac arrhythmia  - Respiratory Failure/COPD  - Renal failure  - Anemia  - Advanced Liver disease

## 2022-04-24 NOTE — H&P (View-Only) (Signed)
TOTAL KNEE ADMISSION H&P  Patient is being admitted for right total knee arthroplasty.  Subjective:  Chief Complaint:right knee pain.  HPI: Benjamin Barton, 47 y.o. male, has a history of pain and functional disability in the right knee due to arthritis and has failed non-surgical conservative treatments for greater than 12 weeks to includeNSAID's and/or analgesics, corticosteriod injections, flexibility and strengthening excercises, use of assistive devices, and activity modification.  Onset of symptoms was gradual, starting 3 years ago with rapidlly worsening course since that time. The patient noted no past surgery on the right knee(s).  Patient currently rates pain in the right knee(s) at 10 out of 10 with activity. Patient has night pain, worsening of pain with activity and weight bearing, pain that interferes with activities of daily living, pain with passive range of motion, crepitus, and joint swelling.  Patient has evidence of subchondral cysts, subchondral sclerosis, periarticular osteophytes, and joint space narrowing by imaging studies. There is no active infection.  There are no problems to display for this patient.  Past Medical History:  Diagnosis Date   Arthritis    GERD (gastroesophageal reflux disease)    Hypertension    Plantar fasciitis    both feet   Umbilical hernia    small, no surgery    Past Surgical History:  Procedure Laterality Date   TESTICLE SURGERY     undescended testicle    Current Outpatient Medications  Medication Sig Dispense Refill Last Dose   atorvastatin (LIPITOR) 20 MG tablet Take 20 mg by mouth every evening.      metFORMIN (GLUCOPHAGE) 500 MG tablet Take 500 mg by mouth 2 (two) times daily with a meal.      No current facility-administered medications for this visit.   No Known Allergies  Social History   Tobacco Use   Smoking status: Some Days    Types: Cigarettes   Smokeless tobacco: Never  Substance Use Topics   Alcohol use:  Not Currently    No family history on file.   Review of Systems  Musculoskeletal:  Positive for arthralgias, gait problem and joint swelling.  All other systems reviewed and are negative.   Objective:  Physical Exam Constitutional:      Appearance: Normal appearance.  HENT:     Head: Normocephalic and atraumatic.     Nose: Nose normal.     Mouth/Throat:     Mouth: Mucous membranes are moist.     Pharynx: Oropharynx is clear.  Eyes:     Conjunctiva/sclera: Conjunctivae normal.  Cardiovascular:     Rate and Rhythm: Normal rate and regular rhythm.     Pulses: Normal pulses.     Heart sounds: Normal heart sounds.  Pulmonary:     Effort: Pulmonary effort is normal.     Breath sounds: Normal breath sounds.  Abdominal:     General: Abdomen is flat.     Palpations: Abdomen is soft.  Genitourinary:    Comments: deferred Musculoskeletal:     Cervical back: Normal range of motion and neck supple.     Comments: Examination of the right knee reveals no skin wounds or lesions. He has swelling and an effusion. No warmth or erythema. He has exquisite tenderness to palpation over the medial joint line. His range of motion is 15 to 95 degrees without any ligamentous instability. No extensor lag.  Distally, there is no focal motor or sensory deficit. He has palpable pedal pulses.  He ambulates with a severely antalgic gait.  Skin:  General: Skin is warm and dry.     Capillary Refill: Capillary refill takes less than 2 seconds.  Neurological:     General: No focal deficit present.     Mental Status: He is alert and oriented to person, place, and time.  Psychiatric:        Mood and Affect: Mood normal.        Behavior: Behavior normal.        Thought Content: Thought content normal.        Judgment: Judgment normal.     Vital signs in last 24 hours: '@VSRANGES'$ @  Labs:   Estimated body mass index is 33.75 kg/m as calculated from the following:   Height as of 04/18/22: '6\' 3"'$   (1.905 m).   Weight as of 04/18/22: 122.5 kg.   Imaging Review Plain radiographs demonstrate severe degenerative joint disease of the right knee(s). The overall alignment issignificant varus. The bone quality appears to be adequate for age and reported activity level.      Assessment/Plan:  End stage arthritis, right knee   The patient history, physical examination, clinical judgment of the provider and imaging studies are consistent with end stage degenerative joint disease of the right knee(s) and total knee arthroplasty is deemed medically necessary. The treatment options including medical management, injection therapy arthroscopy and arthroplasty were discussed at length. The risks and benefits of total knee arthroplasty were presented and reviewed. The risks due to aseptic loosening, infection, stiffness, patella tracking problems, thromboembolic complications and other imponderables were discussed. The patient acknowledged the explanation, agreed to proceed with the plan and consent was signed. Patient is being admitted for inpatient treatment for surgery, pain control, PT, OT, prophylactic antibiotics, VTE prophylaxis, progressive ambulation and ADL's and discharge planning. The patient is planning to be discharged home with OPPT.   Therapy Plans: outpatient therapy. 1st PT pre-eval 04/23/22. Might want to do [facility] 05/06/22. Printed Rx and gave to patient.  Disposition: Home with wife Planned DVT Prophylaxis: aspirin '81mg'$  BID DME needed: walker. Will consider iceman.  PCP: Cleared TXA: IV Allergies: NDKA.  Anesthesia Concerns: None.  BMI: 33.3 Last HgbA1c: 6.8 Other: - T2DM, Metformin.  - Oxycodone, zofran, methocarbamol, has diclofenac. - 04/18/22: Hgb 15.7, Cr. 0.98, K+ 3.7    Patient's anticipated LOS is less than 2 midnights, meeting these requirements: - Younger than 39 - Lives within 1 hour of care - Has a competent adult at home to recover with  post-op recover - NO history of  - Chronic pain requiring opiods  - Diabetes  - Coronary Artery Disease  - Heart failure  - Heart attack  - Stroke  - DVT/VTE  - Cardiac arrhythmia  - Respiratory Failure/COPD  - Renal failure  - Anemia  - Advanced Liver disease

## 2022-05-01 ENCOUNTER — Ambulatory Visit (HOSPITAL_COMMUNITY): Payer: BC Managed Care – PPO | Admitting: Certified Registered Nurse Anesthetist

## 2022-05-01 ENCOUNTER — Other Ambulatory Visit (HOSPITAL_COMMUNITY): Payer: Self-pay

## 2022-05-01 ENCOUNTER — Other Ambulatory Visit: Payer: Self-pay

## 2022-05-01 ENCOUNTER — Encounter (HOSPITAL_COMMUNITY): Payer: Self-pay | Admitting: Orthopedic Surgery

## 2022-05-01 ENCOUNTER — Encounter (HOSPITAL_COMMUNITY)
Admission: RE | Disposition: A | Discharge status: Home / Self Care | Payer: Self-pay | Source: Ambulatory Visit | Attending: Orthopedic Surgery

## 2022-05-01 ENCOUNTER — Ambulatory Visit (HOSPITAL_COMMUNITY): Payer: BC Managed Care – PPO

## 2022-05-01 ENCOUNTER — Ambulatory Visit (HOSPITAL_COMMUNITY)
Admission: RE | Admit: 2022-05-01 | Discharge: 2022-05-01 | Disposition: A | Discharge status: Home / Self Care | Payer: BC Managed Care – PPO | Source: Ambulatory Visit | Attending: Orthopedic Surgery | Admitting: Orthopedic Surgery

## 2022-05-01 DIAGNOSIS — K219 Gastro-esophageal reflux disease without esophagitis: Secondary | ICD-10-CM | POA: Insufficient documentation

## 2022-05-01 DIAGNOSIS — F17210 Nicotine dependence, cigarettes, uncomplicated: Secondary | ICD-10-CM | POA: Insufficient documentation

## 2022-05-01 DIAGNOSIS — I10 Essential (primary) hypertension: Secondary | ICD-10-CM | POA: Diagnosis not present

## 2022-05-01 DIAGNOSIS — Z96651 Presence of right artificial knee joint: Secondary | ICD-10-CM

## 2022-05-01 DIAGNOSIS — M1711 Unilateral primary osteoarthritis, right knee: Secondary | ICD-10-CM | POA: Diagnosis present

## 2022-05-01 DIAGNOSIS — E119 Type 2 diabetes mellitus without complications: Secondary | ICD-10-CM

## 2022-05-01 HISTORY — PX: KNEE ARTHROPLASTY: SHX992

## 2022-05-01 LAB — GLUCOSE, CAPILLARY: Glucose-Capillary: 117 mg/dL — ABNORMAL HIGH (ref 70–99)

## 2022-05-01 LAB — HEMOGLOBIN A1C
Hgb A1c MFr Bld: 6.5 % — ABNORMAL HIGH (ref 4.8–5.6)
Mean Plasma Glucose: 140 mg/dL

## 2022-05-01 SURGERY — ARTHROPLASTY, KNEE, TOTAL, USING IMAGELESS COMPUTER-ASSISTED NAVIGATION
Anesthesia: Monitor Anesthesia Care | Site: Knee | Laterality: Right

## 2022-05-01 MED ORDER — METHOCARBAMOL 500 MG IVPB - SIMPLE MED
500.0000 mg | Freq: Four times a day (QID) | INTRAVENOUS | Status: DC | PRN
  Administered 2022-05-01: 500 mg via INTRAVENOUS
  Filled 2022-05-01: qty 55

## 2022-05-01 MED ORDER — LACTATED RINGERS IV SOLN: INTRAVENOUS | Status: DC

## 2022-05-01 MED ORDER — PROPOFOL 500 MG/50ML IV EMUL
INTRAVENOUS | Status: DC | PRN
  Administered 2022-05-01: 30 mg via INTRAVENOUS
  Administered 2022-05-01: 100 ug/kg/min via INTRAVENOUS

## 2022-05-01 MED ORDER — POVIDONE-IODINE 10 % EX SWAB: 2.0000 | Freq: Once | CUTANEOUS | Status: DC

## 2022-05-01 MED ORDER — FENTANYL CITRATE (PF) 100 MCG/2ML IJ SOLN
INTRAMUSCULAR | Status: DC | PRN
  Administered 2022-05-01 (×2): 25 ug via INTRAVENOUS
  Administered 2022-05-01: 50 ug via INTRAVENOUS
  Administered 2022-05-01: 100 ug via INTRAVENOUS

## 2022-05-01 MED ORDER — ISOPROPYL ALCOHOL 70 % SOLN
Status: DC | PRN
  Administered 2022-05-01: 1 via TOPICAL

## 2022-05-01 MED ORDER — ONDANSETRON HCL 4 MG/2ML IJ SOLN
INTRAMUSCULAR | Status: AC
  Filled 2022-05-01: qty 2

## 2022-05-01 MED ORDER — BUPIVACAINE-EPINEPHRINE (PF) 0.25% -1:200000 IJ SOLN
INTRAMUSCULAR | Status: AC
  Filled 2022-05-01: qty 30

## 2022-05-01 MED ORDER — METOCLOPRAMIDE HCL 5 MG PO TABS: 5.0000 mg | ORAL_TABLET | Freq: Three times a day (TID) | ORAL | Status: DC | PRN

## 2022-05-01 MED ORDER — ACETAMINOPHEN 325 MG PO TABS: 325.0000 mg | ORAL_TABLET | Freq: Four times a day (QID) | ORAL | Status: DC | PRN

## 2022-05-01 MED ORDER — FENTANYL CITRATE (PF) 100 MCG/2ML IJ SOLN
INTRAMUSCULAR | Status: AC
  Filled 2022-05-01: qty 2

## 2022-05-01 MED ORDER — CEFAZOLIN SODIUM-DEXTROSE 2-4 GM/100ML-% IV SOLN
INTRAVENOUS | Status: AC
  Filled 2022-05-01: qty 100

## 2022-05-01 MED ORDER — 0.9 % SODIUM CHLORIDE (POUR BTL) OPTIME
TOPICAL | Status: DC | PRN
  Administered 2022-05-01: 1000 mL

## 2022-05-01 MED ORDER — SODIUM CHLORIDE 0.9 % IR SOLN
Status: DC | PRN
  Administered 2022-05-01: 100 mL
  Administered 2022-05-01: 1000 mL

## 2022-05-01 MED ORDER — BUPIVACAINE IN DEXTROSE 0.75-8.25 % IT SOLN
INTRATHECAL | Status: DC | PRN
  Administered 2022-05-01: 1.8 mL via INTRATHECAL

## 2022-05-01 MED ORDER — DEXMEDETOMIDINE HCL IN NACL 80 MCG/20ML IV SOLN
INTRAVENOUS | Status: DC | PRN
  Administered 2022-05-01: 12 ug via BUCCAL

## 2022-05-01 MED ORDER — CEFAZOLIN IN SODIUM CHLORIDE 3-0.9 GM/100ML-% IV SOLN
3.0000 g | INTRAVENOUS | Status: AC
  Administered 2022-05-01: 3 g via INTRAVENOUS
  Filled 2022-05-01: qty 100

## 2022-05-01 MED ORDER — BUPIVACAINE-EPINEPHRINE 0.25% -1:200000 IJ SOLN
INTRAMUSCULAR | Status: DC | PRN
  Administered 2022-05-01: 30 mL

## 2022-05-01 MED ORDER — KETOROLAC TROMETHAMINE 15 MG/ML IJ SOLN
15.0000 mg | Freq: Four times a day (QID) | INTRAMUSCULAR | Status: DC
  Administered 2022-05-01: 15 mg via INTRAVENOUS

## 2022-05-01 MED ORDER — KETOROLAC TROMETHAMINE 30 MG/ML IJ SOLN
INTRAMUSCULAR | Status: DC | PRN
  Administered 2022-05-01: 30 mg

## 2022-05-01 MED ORDER — SODIUM CHLORIDE (PF) 0.9 % IJ SOLN
INTRAMUSCULAR | Status: AC
  Filled 2022-05-01: qty 30

## 2022-05-01 MED ORDER — OXYCODONE HCL 5 MG PO TABS
ORAL_TABLET | ORAL | Status: AC
  Filled 2022-05-01: qty 3

## 2022-05-01 MED ORDER — ONDANSETRON HCL 4 MG/2ML IJ SOLN
INTRAMUSCULAR | Status: DC | PRN
  Administered 2022-05-01: 4 mg via INTRAVENOUS

## 2022-05-01 MED ORDER — SENNA 8.6 MG PO TABS
2.0000 | ORAL_TABLET | Freq: Every day | ORAL | 1 refills | Status: AC
  Filled 2022-05-01: qty 60, 30d supply, fill #0

## 2022-05-01 MED ORDER — HYDROMORPHONE HCL 1 MG/ML IJ SOLN
0.2500 mg | INTRAMUSCULAR | Status: DC | PRN
  Administered 2022-05-01 (×4): 0.5 mg via INTRAVENOUS

## 2022-05-01 MED ORDER — TRANEXAMIC ACID-NACL 1000-0.7 MG/100ML-% IV SOLN
1000.0000 mg | INTRAVENOUS | Status: AC
  Administered 2022-05-01: 1000 mg via INTRAVENOUS
  Filled 2022-05-01: qty 100

## 2022-05-01 MED ORDER — ONDANSETRON HCL 4 MG PO TABS
4.0000 mg | ORAL_TABLET | Freq: Three times a day (TID) | ORAL | 0 refills | Status: AC | PRN
  Filled 2022-05-01: qty 20, 7d supply, fill #0

## 2022-05-01 MED ORDER — ROPIVACAINE HCL 5 MG/ML IJ SOLN
INTRAMUSCULAR | Status: DC | PRN
  Administered 2022-05-01: 20 mL via PERINEURAL

## 2022-05-01 MED ORDER — METHOCARBAMOL 500 MG IVPB - SIMPLE MED
INTRAVENOUS | Status: AC
  Filled 2022-05-01: qty 55

## 2022-05-01 MED ORDER — HYDROMORPHONE HCL 1 MG/ML IJ SOLN: 0.5000 mg | INTRAMUSCULAR | Status: DC | PRN

## 2022-05-01 MED ORDER — LACTATED RINGERS IV BOLUS
500.0000 mL | Freq: Once | INTRAVENOUS | Status: AC
  Administered 2022-05-01: 500 mL via INTRAVENOUS

## 2022-05-01 MED ORDER — POLYETHYLENE GLYCOL 3350 17 G PO PACK
17.0000 g | PACK | Freq: Every day | ORAL | 0 refills | Status: AC | PRN
  Filled 2022-05-01: qty 14, 14d supply, fill #0

## 2022-05-01 MED ORDER — METOCLOPRAMIDE HCL 5 MG/ML IJ SOLN: 5.0000 mg | Freq: Three times a day (TID) | INTRAMUSCULAR | Status: DC | PRN

## 2022-05-01 MED ORDER — OXYCODONE HCL 5 MG PO TABS
10.0000 mg | ORAL_TABLET | ORAL | Status: DC | PRN
  Administered 2022-05-01: 15 mg via ORAL

## 2022-05-01 MED ORDER — OXYCODONE HCL 5 MG PO TABS: 5.0000 mg | ORAL_TABLET | Freq: Once | ORAL | Status: DC | PRN

## 2022-05-01 MED ORDER — ASPIRIN 81 MG PO CHEW
81.0000 mg | CHEWABLE_TABLET | Freq: Two times a day (BID) | ORAL | 0 refills | Status: AC
  Filled 2022-05-01: qty 90, 45d supply, fill #0

## 2022-05-01 MED ORDER — ORAL CARE MOUTH RINSE: 15.0000 mL | Freq: Once | OROMUCOSAL | Status: AC

## 2022-05-01 MED ORDER — KETOROLAC TROMETHAMINE 15 MG/ML IJ SOLN
INTRAMUSCULAR | Status: AC
  Filled 2022-05-01: qty 1

## 2022-05-01 MED ORDER — ACETAMINOPHEN 500 MG PO TABS
1000.0000 mg | ORAL_TABLET | Freq: Three times a day (TID) | ORAL | 0 refills | Status: AC
  Filled 2022-05-01: qty 180, 30d supply, fill #0

## 2022-05-01 MED ORDER — METHOCARBAMOL 500 MG PO TABS
500.0000 mg | ORAL_TABLET | Freq: Four times a day (QID) | ORAL | 0 refills | Status: AC | PRN
  Filled 2022-05-01: qty 30, 8d supply, fill #0

## 2022-05-01 MED ORDER — ACETAMINOPHEN 500 MG PO TABS
1000.0000 mg | ORAL_TABLET | Freq: Once | ORAL | Status: AC
  Administered 2022-05-01: 1000 mg via ORAL
  Filled 2022-05-01: qty 2

## 2022-05-01 MED ORDER — OXYCODONE HCL 5 MG/5ML PO SOLN: 5.0000 mg | Freq: Once | ORAL | Status: DC | PRN

## 2022-05-01 MED ORDER — OXYCODONE HCL 5 MG PO TABS
5.0000 mg | ORAL_TABLET | ORAL | 0 refills | Status: AC | PRN
  Filled 2022-05-01: qty 42, 7d supply, fill #0

## 2022-05-01 MED ORDER — MIDAZOLAM HCL 2 MG/2ML IJ SOLN
INTRAMUSCULAR | Status: AC
  Filled 2022-05-01: qty 2

## 2022-05-01 MED ORDER — CHLORHEXIDINE GLUCONATE 0.12 % MT SOLN
15.0000 mL | Freq: Once | OROMUCOSAL | Status: AC
  Administered 2022-05-01: 15 mL via OROMUCOSAL

## 2022-05-01 MED ORDER — CEFAZOLIN SODIUM-DEXTROSE 2-4 GM/100ML-% IV SOLN
2.0000 g | Freq: Four times a day (QID) | INTRAVENOUS | Status: DC
  Administered 2022-05-01: 2 g via INTRAVENOUS

## 2022-05-01 MED ORDER — METHOCARBAMOL 500 MG PO TABS: 500.0000 mg | ORAL_TABLET | Freq: Four times a day (QID) | ORAL | Status: DC | PRN

## 2022-05-01 MED ORDER — FENTANYL CITRATE PF 50 MCG/ML IJ SOSY: 50.0000 ug | PREFILLED_SYRINGE | INTRAMUSCULAR | Status: DC

## 2022-05-01 MED ORDER — HYDROMORPHONE HCL 1 MG/ML IJ SOLN
INTRAMUSCULAR | Status: AC
  Filled 2022-05-01: qty 1

## 2022-05-01 MED ORDER — ONDANSETRON HCL 4 MG PO TABS: 4.0000 mg | ORAL_TABLET | Freq: Four times a day (QID) | ORAL | Status: DC | PRN

## 2022-05-01 MED ORDER — ONDANSETRON HCL 4 MG/2ML IJ SOLN: 4.0000 mg | Freq: Four times a day (QID) | INTRAMUSCULAR | Status: DC | PRN

## 2022-05-01 MED ORDER — KETOROLAC TROMETHAMINE 30 MG/ML IJ SOLN
INTRAMUSCULAR | Status: AC
  Filled 2022-05-01: qty 1

## 2022-05-01 MED ORDER — LACTATED RINGERS IV BOLUS: 250.0000 mL | Freq: Once | INTRAVENOUS | Status: DC

## 2022-05-01 MED ORDER — DEXAMETHASONE SODIUM PHOSPHATE 10 MG/ML IJ SOLN
INTRAMUSCULAR | Status: DC | PRN
  Administered 2022-05-01: 10 mg via INTRAVENOUS

## 2022-05-01 MED ORDER — OXYCODONE HCL 5 MG PO TABS
ORAL_TABLET | ORAL | Status: AC
  Filled 2022-05-01: qty 2

## 2022-05-01 MED ORDER — DOCUSATE SODIUM 100 MG PO CAPS
100.0000 mg | ORAL_CAPSULE | Freq: Two times a day (BID) | ORAL | 1 refills | Status: AC
  Filled 2022-05-01: qty 100, 50d supply, fill #0

## 2022-05-01 MED ORDER — STERILE WATER FOR IRRIGATION IR SOLN
Status: DC | PRN
  Administered 2022-05-01 (×2): 1000 mL

## 2022-05-01 MED ORDER — ISOPROPYL ALCOHOL 70 % SOLN
Status: AC
  Filled 2022-05-01: qty 480

## 2022-05-01 MED ORDER — DICLOFENAC SODIUM 75 MG PO TBEC
75.0000 mg | DELAYED_RELEASE_TABLET | Freq: Two times a day (BID) | ORAL | 3 refills | Status: AC
  Filled 2022-05-01: qty 60, 30d supply, fill #0

## 2022-05-01 MED ORDER — PROMETHAZINE HCL 25 MG/ML IJ SOLN: 6.2500 mg | INTRAMUSCULAR | Status: DC | PRN

## 2022-05-01 MED ORDER — MIDAZOLAM HCL 2 MG/2ML IJ SOLN
1.0000 mg | INTRAMUSCULAR | Status: AC
  Administered 2022-05-01: 2 mg via INTRAVENOUS

## 2022-05-01 MED ORDER — SODIUM CHLORIDE (PF) 0.9 % IJ SOLN
INTRAMUSCULAR | Status: DC | PRN
  Administered 2022-05-01: 10 mL

## 2022-05-01 MED ORDER — PROPOFOL 1000 MG/100ML IV EMUL
INTRAVENOUS | Status: AC
  Filled 2022-05-01: qty 100

## 2022-05-01 MED ORDER — LIDOCAINE 2% (20 MG/ML) 5 ML SYRINGE
INTRAMUSCULAR | Status: DC | PRN
  Administered 2022-05-01: 60 mg via INTRAVENOUS

## 2022-05-01 MED ORDER — OXYCODONE HCL 5 MG PO TABS
5.0000 mg | ORAL_TABLET | ORAL | Status: DC | PRN
  Administered 2022-05-01: 10 mg via ORAL

## 2022-05-01 MED ORDER — DEXAMETHASONE SODIUM PHOSPHATE 10 MG/ML IJ SOLN
INTRAMUSCULAR | Status: AC
  Filled 2022-05-01: qty 1

## 2022-05-01 SURGICAL SUPPLY — 74 items
ADH SKN CLS APL DERMABOND .7 (GAUZE/BANDAGES/DRESSINGS) ×2
ADH SKN CLS LQ APL DERMABOND (GAUZE/BANDAGES/DRESSINGS) ×1
APL PRP STRL LF DISP 70% ISPRP (MISCELLANEOUS) ×2
BAG COUNTER SPONGE SURGICOUNT (BAG) IMPLANT
BAG SPEC THK2 15X12 ZIP CLS (MISCELLANEOUS)
BAG SPNG CNTER NS LX DISP (BAG)
BAG ZIPLOCK 12X15 (MISCELLANEOUS) IMPLANT
BATTERY INSTRU NAVIGATION (MISCELLANEOUS) ×3 IMPLANT
BLADE SAW RECIPROCATING 77.5 (BLADE) ×1 IMPLANT
BNDG CMPR 5X62 HK CLSR LF (GAUZE/BANDAGES/DRESSINGS) ×1
BNDG ELASTIC 4X5.8 VLCR STR LF (GAUZE/BANDAGES/DRESSINGS) ×1 IMPLANT
BNDG ELASTIC 6INX 5YD STR LF (GAUZE/BANDAGES/DRESSINGS) ×1 IMPLANT
BNDG ELASTIC 6X5.8 VLCR STR LF (GAUZE/BANDAGES/DRESSINGS) IMPLANT
BTRY SRG DRVR LF (MISCELLANEOUS) ×3
CHLORAPREP W/TINT 26 (MISCELLANEOUS) ×2 IMPLANT
COMP PATELLA POR NG 41X10 (Knees) ×1 IMPLANT
COMP TIB PS KNEE 0D H RT (Joint) ×1 IMPLANT
COMPONENT PATELLA POR NG 41X10 (Knees) IMPLANT
COMPONET TIB PS KNEE 0D H RT (Joint) IMPLANT
COVER SURGICAL LIGHT HANDLE (MISCELLANEOUS) ×1 IMPLANT
DERMABOND ADVANCED .7 DNX12 (GAUZE/BANDAGES/DRESSINGS) ×2 IMPLANT
DERMABOND ADVANCED .7 DNX6 (GAUZE/BANDAGES/DRESSINGS) IMPLANT
DRAPE SHEET LG 3/4 BI-LAMINATE (DRAPES) ×3 IMPLANT
DRAPE U-SHAPE 47X51 STRL (DRAPES) ×1 IMPLANT
DRSG AQUACEL AG ADV 3.5X10 (GAUZE/BANDAGES/DRESSINGS) ×1 IMPLANT
ELECT BLADE TIP CTD 4 INCH (ELECTRODE) ×1 IMPLANT
ELECT REM PT RETURN 15FT ADLT (MISCELLANEOUS) ×1 IMPLANT
GAUZE SPONGE 4X4 12PLY STRL (GAUZE/BANDAGES/DRESSINGS) ×1 IMPLANT
GLOVE BIO SURGEON STRL SZ7 (GLOVE) ×1 IMPLANT
GLOVE BIO SURGEON STRL SZ8.5 (GLOVE) ×2 IMPLANT
GLOVE BIOGEL PI IND STRL 7.5 (GLOVE) ×1 IMPLANT
GLOVE BIOGEL PI IND STRL 8.5 (GLOVE) ×1 IMPLANT
GOWN SPEC L3 XXLG W/TWL (GOWN DISPOSABLE) ×1 IMPLANT
GOWN STRL REUS W/ TWL XL LVL3 (GOWN DISPOSABLE) ×1 IMPLANT
GOWN STRL REUS W/TWL XL LVL3 (GOWN DISPOSABLE) ×1
HANDPIECE INTERPULSE COAX TIP (DISPOSABLE) ×1
HDLS TROCR DRIL PIN KNEE 75 (PIN) ×2
HOLDER FOLEY CATH W/STRAP (MISCELLANEOUS) ×1 IMPLANT
HOOD PEEL AWAY T7 (MISCELLANEOUS) ×3 IMPLANT
INSERT TIBIAL PERSONA RT 11 (Joint) IMPLANT
KIT TURNOVER KIT A (KITS) IMPLANT
MARKER SKIN DUAL TIP RULER LAB (MISCELLANEOUS) ×1 IMPLANT
NDL SAFETY ECLIP 18X1.5 (MISCELLANEOUS) ×1 IMPLANT
NDL SPNL 18GX3.5 QUINCKE PK (NEEDLE) ×1 IMPLANT
NEEDLE SPNL 18GX3.5 QUINCKE PK (NEEDLE) ×1 IMPLANT
NS IRRIG 1000ML POUR BTL (IV SOLUTION) ×1 IMPLANT
PACK TOTAL KNEE CUSTOM (KITS) ×1 IMPLANT
PADDING CAST COTTON 6X4 STRL (CAST SUPPLIES) ×1 IMPLANT
PIN DRILL HDLS TROCAR 75 4PK (PIN) IMPLANT
PROS FEM KNEE PS STD 9 RT (Joint) ×1 IMPLANT
PROSTHESIS FEM KNEE PS STD9 RT (Joint) IMPLANT
PROTECTOR NERVE ULNAR (MISCELLANEOUS) ×1 IMPLANT
SAW OSC TIP CART 19.5X105X1.3 (SAW) ×1 IMPLANT
SCREW FEMALE HEX FIX 25X2.5 (ORTHOPEDIC DISPOSABLE SUPPLIES) IMPLANT
SEALER BIPOLAR AQUA 6.0 (INSTRUMENTS) ×1 IMPLANT
SET HNDPC FAN SPRY TIP SCT (DISPOSABLE) ×1 IMPLANT
SET PAD KNEE POSITIONER (MISCELLANEOUS) ×1 IMPLANT
SOLUTION PRONTOSAN WOUND 350ML (IRRIGATION / IRRIGATOR) IMPLANT
SPIKE FLUID TRANSFER (MISCELLANEOUS) ×2 IMPLANT
SUT MNCRL AB 3-0 PS2 18 (SUTURE) ×1 IMPLANT
SUT MNCRL AB 4-0 PS2 18 (SUTURE) IMPLANT
SUT MON AB 2-0 CT1 36 (SUTURE) ×1 IMPLANT
SUT STRATAFIX PDO 1 14 VIOLET (SUTURE) ×1
SUT STRATFX PDO 1 14 VIOLET (SUTURE) ×1
SUT VIC AB 1 CTX 36 (SUTURE) ×2
SUT VIC AB 1 CTX36XBRD ANBCTR (SUTURE) ×2 IMPLANT
SUT VIC AB 2-0 CT1 27 (SUTURE) ×1
SUT VIC AB 2-0 CT1 TAPERPNT 27 (SUTURE) ×1 IMPLANT
SUTURE STRATFX PDO 1 14 VIOLET (SUTURE) ×1 IMPLANT
SYR 3ML LL SCALE MARK (SYRINGE) ×1 IMPLANT
TRAY FOLEY MTR SLVR 16FR STAT (SET/KITS/TRAYS/PACK) IMPLANT
TUBE SUCTION HIGH CAP CLEAR NV (SUCTIONS) ×1 IMPLANT
WATER STERILE IRR 1000ML POUR (IV SOLUTION) ×2 IMPLANT
WRAP KNEE MAXI GEL POST OP (GAUZE/BANDAGES/DRESSINGS) IMPLANT

## 2022-05-01 NOTE — Anesthesia Preprocedure Evaluation (Signed)
Anesthesia Evaluation  Patient identified by MRN, date of birth, ID band Patient awake    Reviewed: Allergy & Precautions, H&P , NPO status , Patient's Chart, lab work & pertinent test results  Airway Mallampati: II  TM Distance: >3 FB Neck ROM: Full    Dental no notable dental hx.    Pulmonary neg pulmonary ROS, Current Smoker and Patient abstained from smoking.   Pulmonary exam normal breath sounds clear to auscultation       Cardiovascular hypertension, negative cardio ROS Normal cardiovascular exam Rhythm:Regular Rate:Normal     Neuro/Psych negative neurological ROS  negative psych ROS   GI/Hepatic Neg liver ROS,GERD  ,,  Endo/Other  negative endocrine ROS    Renal/GU negative Renal ROS  negative genitourinary   Musculoskeletal  (+) Arthritis , Osteoarthritis,    Abdominal  (+) + obese  Peds negative pediatric ROS (+)  Hematology negative hematology ROS (+)   Anesthesia Other Findings   Reproductive/Obstetrics negative OB ROS                             Anesthesia Physical Anesthesia Plan  ASA: 2  Anesthesia Plan: MAC and Spinal   Post-op Pain Management: Tylenol PO (pre-op)* and Regional block*   Induction: Intravenous  PONV Risk Score and Plan: 1 and Ondansetron and Treatment may vary due to age or medical condition  Airway Management Planned: Simple Face Mask  Additional Equipment:   Intra-op Plan:   Post-operative Plan:   Informed Consent: I have reviewed the patients History and Physical, chart, labs and discussed the procedure including the risks, benefits and alternatives for the proposed anesthesia with the patient or authorized representative who has indicated his/her understanding and acceptance.     Dental advisory given  Plan Discussed with: CRNA  Anesthesia Plan Comments:        Anesthesia Quick Evaluation

## 2022-05-01 NOTE — Anesthesia Postprocedure Evaluation (Signed)
Anesthesia Post Note  Patient: Lexicographer  Procedure(s) Performed: COMPUTER ASSISTED TOTAL KNEE ARTHROPLASTY (Right: Knee)     Patient location during evaluation: PACU Anesthesia Type: MAC Level of consciousness: awake and alert Pain management: pain level controlled Vital Signs Assessment: post-procedure vital signs reviewed and stable Respiratory status: spontaneous breathing, nonlabored ventilation and respiratory function stable Cardiovascular status: blood pressure returned to baseline and stable Postop Assessment: no apparent nausea or vomiting Anesthetic complications: no   No notable events documented.  Last Vitals:  Vitals:   05/01/22 1230 05/01/22 1245  BP: 123/77 (!) 129/91  Pulse: 90 89  Resp: 13 14  Temp: 36.4 C   SpO2: 93% 95%    Last Pain:  Vitals:   05/01/22 1230  TempSrc:   PainSc: [place]

## 2022-05-01 NOTE — Discharge Instructions (Signed)
Dr. Rod Can Total Joint Specialist [facility] [address], [place], [place] [address] [phone]  TOTAL KNEE REPLACEMENT POSTOPERATIVE DIRECTIONS    Knee Rehabilitation, Guidelines Following Surgery  Results after knee surgery are often greatly improved when you follow the exercise, range of motion and muscle strengthening exercises prescribed by your doctor. Safety measures are also important to protect the knee from further injury. Any time any of these exercises cause you to have increased pain or swelling in your knee joint, decrease the amount until you are comfortable again and slowly increase them. If you have problems or questions, call your caregiver or physical therapist for advice.   WEIGHT BEARING Weight bearing as tolerated with assist device (walker, cane, etc) as directed, use it as long as suggested by your surgeon or therapist, typically at least 4-6 weeks.  HOME CARE INSTRUCTIONS  Remove items at home which could result in a fall. This includes throw rugs or furniture in walking pathways.  Continue medications as instructed at time of discharge. You may have some home medications which will be placed on hold until you complete the course of blood thinner medication.  You may start showering once you are discharged home but do not submerge the incision under water. Just pat the incision dry and apply a dry gauze dressing on daily. Walk with walker as instructed.  You may resume a sexual relationship in one month or when given the OK by your doctor.  Use walker as long as suggested by your caregivers. Avoid periods of inactivity such as sitting longer than an hour when not asleep. This helps prevent blood clots.  You may put full weight on your legs and walk as much as is comfortable.  You may return to work once you are cleared by your doctor.  Do not drive a car for 6 weeks or until released by you surgeon.  Do not drive while  taking narcotics.  Wear the elastic stockings for three weeks following surgery during the day but you may remove then at night. Make sure you keep all of your appointments after your operation with all of your doctors and caregivers. You should call the office at the above phone number and make an appointment for approximately two weeks after the date of your surgery. Do not remove your surgical dressing. The dressing is waterproof; you may take showers in 3 days, but do not take tub baths or submerge the dressing. Please pick up a stool softener and laxative for home use as long as you are requiring pain medications. ICE to the affected knee every three hours for 30 minutes at a time and then as needed for pain and swelling.  Continue to use ice on the knee for pain and swelling from surgery. You may notice swelling that will progress down to the foot and ankle.  This is normal after surgery.  Elevate the leg when you are not up walking on it.   It is important for you to complete the blood thinner medication as prescribed by your doctor. Continue to use the breathing machine which will help keep your temperature down.  It is common for your temperature to cycle up and down following surgery, especially at night when you are not up moving around and exerting yourself.  The breathing machine keeps your lungs expanded and your temperature down.  RANGE OF MOTION AND STRENGTHENING EXERCISES  Rehabilitation of the knee is important following a knee injury or an  operation. After just a few days of immobilization, the muscles of the thigh which control the knee become weakened and shrink (atrophy). Knee exercises are designed to build up the tone and strength of the thigh muscles and to improve knee motion. Often times heat used for twenty to thirty minutes before working out will loosen up your tissues and help with improving the range of motion but do not use heat for the first two weeks following surgery.  These exercises can be done on a training (exercise) mat, on the floor, on a table or on a bed. Use what ever works the best and is most comfortable for you Knee exercises include:  Leg Lifts - While your knee is still immobilized in a splint or cast, you can do straight leg raises. Lift the leg to 60 degrees, hold for 3 sec, and slowly lower the leg. Repeat 10-20 times 2-3 times daily. Perform this exercise against resistance later as your knee gets better.  Quad and Hamstring Sets - Tighten up the muscle on the front of the thigh (Quad) and hold for 5-10 sec. Repeat this 10-20 times hourly. Hamstring sets are done by pushing the foot backward against an object and holding for 5-10 sec. Repeat as with quad sets.  A rehabilitation program following serious knee injuries can speed recovery and prevent re-injury in the future due to weakened muscles. Contact your doctor or a physical therapist for more information on knee rehabilitation.   POST-OPERATIVE OPIOID TAPER INSTRUCTIONS: It is important to wean off of your opioid medication as soon as possible. If you do not need pain medication after your surgery it is ok to stop day one. Opioids include: Codeine, Hydrocodone(Norco, Vicodin), Oxycodone(Percocet, oxycontin) and hydromorphone amongst others.  Long term and even short term use of opiods can cause: Increased pain response Dependence Constipation Depression Respiratory depression And more.  Withdrawal symptoms can include Flu like symptoms Nausea, vomiting And more Techniques to manage these symptoms Hydrate well Eat regular healthy meals Stay active Use relaxation techniques(deep breathing, meditating, yoga) Do Not substitute Alcohol to help with tapering If you have been on opioids for less than two weeks and do not have pain than it is ok to stop all together.  Plan to wean off of opioids This plan should start within one week post op of your joint replacement. Maintain the same  interval or time between taking each dose and first decrease the dose.  Cut the total daily intake of opioids by one tablet each day Next start to increase the time between doses. The last dose that should be eliminated is the evening dose.    SKILLED REHAB INSTRUCTIONS: If the patient is transferred to a skilled rehab facility following release from the hospital, a list of the current medications will be sent to the facility for the patient to continue.  When discharged from the skilled rehab facility, please have the facility set up the patient's [place] prior to being released. Also, the skilled facility will be responsible for providing the patient with their medications at time of release from the facility to include their pain medication, the muscle relaxants, and their blood thinner medication. If the patient is still at the rehab facility at time of the two week follow up appointment, the skilled rehab facility will also need to assist the patient in arranging follow up appointment in our office and any transportation needs.  MAKE SURE YOU:  Understand these instructions.  Will watch  your condition.  Will get help right away if you are not doing well or get worse.    Pick up stool softner and laxative for home use following surgery while on pain medications. Do NOT remove your dressing. You may shower.  Do not take tub baths or submerge incision under water. May shower starting three days after surgery. Please use a clean towel to pat the incision dry following showers. Continue to use ice for pain and swelling after surgery. Do not use any lotions or creams on the incision until instructed by your surgeon.

## 2022-05-01 NOTE — Evaluation (Signed)
Physical Therapy Evaluation Patient Details Name: Benjamin Barton MRN: [id number] DOB: 09-10-1975 Today's Date: 05/01/2022  History of Present Illness  48 yo male presents to therapy s/p R TKA on 05/01/2022 due to failure of conservative measures. Pt has pmh including but not limited to: GERD, HTN and B plantar fasciitis.  Clinical Impression      Benjamin Barton is a 47 y.o. male POD 0 s/p R TKA. Patient reports IND with mobility at baseline. Patient is now limited by functional impairments (see PT problem list below) and requires min guard and cues for transfers and gait with RW. Patient was able to ambulate 18 and 12 feet with RW and min guard and cues for safe walker management pt limited due to reports of fatigue and mild dizziness. Pt indicated 10/10 pain at rest and t/o PT intervention, pt premedicated and pain did not appear to impact pt safety and I with functional mobility tasks.   Patient educated on safe sequencing for stair mobility and verbalized safe guarding position for people assisting with mobility. Patient instructed in exercises to facilitate ROM and circulation reviewed and HO provided. Patient will benefit from continued skilled PT interventions to address impairments and progress towards PLOF. Patient has met mobility goals at adequate level for discharge home with anticipation of OPPT starting 3/11; will continue to follow if pt continues acute stay to progress towards Mod I goals.    Recommendations for follow up therapy are one component of a multi-disciplinary discharge planning process, led by the attending physician.  Recommendations may be updated based on patient status, additional functional criteria and insurance authorization.  Follow Up Recommendations Outpatient PT      Assistance Recommended at Discharge Frequent or constant Supervision/Assistance  Patient can return home with the following  A little help with walking and/or transfers;A little help with  bathing/dressing/bathroom;Assistance with cooking/housework;Assist for transportation;Help with stairs or ramp for entrance    Equipment Recommendations Rolling walker (2 wheels) (provided and adjusted at eval)  Recommendations for Other Services       Functional Status Assessment Patient has had a recent decline in their functional status and demonstrates the ability to make significant improvements in function in a reasonable and predictable amount of time.     Precautions / Restrictions Precautions Precautions: Knee;Fall Restrictions Weight Bearing Restrictions: No RLE Weight Bearing: Weight bearing as tolerated      Mobility  Bed Mobility Overal bed mobility: Needs Assistance Bed Mobility: Supine to Sit     Supine to sit: Min guard     General bed mobility comments: cues for transitioning RLE to EOB    Transfers Overall transfer level: Needs assistance   Transfers: Sit to/from Stand Sit to Stand: Min guard           General transfer comment: cues for proper UE placement    Ambulation/Gait Ambulation/Gait assistance: Min guard Gait Distance (Feet): 18 Feet Assistive device: Rolling walker (2 wheels) Gait Pattern/deviations: Step-to pattern, Trunk flexed Gait velocity: decreased     General Gait Details: cues for proper sequencing, noted use of B UE WB to oflload R LE in stance phase  Stairs Stairs: Yes Stairs assistance: Min guard Stair Management: One rail Right Number of Stairs: 2 General stair comments: Pt able to perform 2 steps with B handrail and min guard and then progressed to 2 steps with B UE support on R handrail to emulate home set up to access B and B on second floor, pt required cues for proper  sequencing and technique  Wheelchair Mobility    Modified Rankin (Stroke Patients Only)       Balance Overall balance assessment: Needs assistance Sitting-balance support: Feet unsupported, Bilateral upper extremity supported Sitting  balance-Leahy Scale: Fair     Standing balance support: Reliant on assistive device for balance Standing balance-Leahy Scale: Poor                               Pertinent Vitals/Pain Pain Assessment Pain Assessment: 0-10 Pain Score: 10-Worst pain ever Pain Location: R knee Pain Descriptors / Indicators: Constant Pain Intervention(s): Limited activity within patient's tolerance, Monitored during session, Premedicated before session, Ice applied    Home Living Family/patient expects to be discharged to:: Private residence Living Arrangements: Spouse/significant other;Children Available Help at Discharge: Family Type of Home: House Home Access: Stairs to enter Entrance Stairs-Rails: None Entrance Stairs-Number of Steps: 6 Alternate Level Stairs-Number of Steps: 3 steps to ender back of home with B handrails Home Layout: Two level;Bed/bath upstairs (14 steps to access B and B can switch with daughter for B and B on first) Home Equipment: None Additional Comments: has not been working since 02/2022 due to a R knee injury at work    Prior Function Prior Level of Function : Independent/Modified Independent;Driving             Mobility Comments: IND with ADLs, self care tasks, IADLs, driving       Hand Dominance        Extremity/Trunk Assessment        Lower Extremity Assessment Lower Extremity Assessment: RLE deficits/detail RLE Deficits / Details: ankle DF/PF 5/5 pt is able to complete SLR < 5 degree lag RLE Sensation: decreased light touch (pt reports grion remians numb)       Communication   Communication: No difficulties  Cognition Arousal/Alertness: Awake/alert Behavior During Therapy: WFL for tasks assessed/performed Overall Cognitive Status: Within Functional Limits for tasks assessed                                          General Comments General comments (skin integrity, edema, etc.): pt reported pain response remained  10/10 t/o PT eval with pt premedicated prior to intervention, pt pain behaviors and response did not impact pt perfomance with functional mobility indicating pt report not aligning with PT pain observation    Exercises Total Joint Exercises Ankle Circles/Pumps: AROM, Both, 20 reps Quad Sets: AROM, Right, 5 reps, Supine Short Arc Quad: AROM, Right, 5 reps, Supine Heel Slides: AROM, Right, 5 reps, Supine Hip ABduction/ADduction: AROM, Right, 5 reps, Supine Straight Leg Raises: AROM, Right, 5 reps, Supine   Assessment/Plan    PT Assessment Patient needs continued PT services  PT Problem List Decreased strength;Decreased range of motion;Decreased activity tolerance;Decreased balance;Decreased mobility;Decreased coordination;Decreased knowledge of use of DME;Pain       PT Treatment Interventions DME instruction;Gait training;Stair training;Functional mobility training;Therapeutic activities;Therapeutic exercise;Balance training;Neuromuscular re-education;Patient/family education;Modalities    PT Goals (Current goals can be found in the Care Plan section)  Acute Rehab PT Goals Patient Stated Goal: to get over this pain PT Goal Formulation: With patient Potential to Achieve Goals: Good    Frequency       Co-evaluation               AM-PAC PT "6 Clicks" Mobility  Outcome Measure Help needed turning from your back to your side while in a flat bed without using bedrails?: A Little Help needed moving from lying on your back to sitting on the side of a flat bed without using bedrails?: A Little Help needed moving to and from a bed to a chair (including a wheelchair)?: A Little Help needed standing up from a chair using your arms (e.g., wheelchair or bedside chair)?: A Little Help needed to walk in hospital room?: A Little Help needed climbing 3-5 steps with a railing? : A Little 6 Click Score: 18    End of Session Equipment Utilized During Treatment: Gait belt Activity  Tolerance: No increased pain;Patient limited by fatigue Patient left: in chair;with call bell/phone within reach;with family/visitor present Nurse Communication: Mobility status;Other (comment) (pt can progress toward d/c from PT perspective however pt indicated mild dizziness with gait and pt pain report indicates uncontrolled at this time.) PT Visit Diagnosis: Unsteadiness on feet (R26.81);Other abnormalities of gait and mobility (R26.89);Muscle weakness (generalized) (M62.81);Pain;Difficulty in walking, not elsewhere classified (R26.2) Pain - Right/Left: Right Pain - part of body: Knee    Time: [id number] PT Time Calculation (min) (ACUTE ONLY): 48 min   Charges:   PT Evaluation $PT Eval Low Complexity: 1 Low PT Treatments $Gait Training: 8-22 mins $Therapeutic Exercise: 8-22 mins        Baird Lyons, PT   Adair Patter 05/01/2022, 3:31 PM

## 2022-05-01 NOTE — Anesthesia Procedure Notes (Signed)
Anesthesia Regional Block: Adductor canal block   Pre-Anesthetic Checklist: , timeout performed,  Correct Patient, Correct Site, Correct Laterality,  Correct Procedure, Correct Position, site marked,  Risks and benefits discussed,  Surgical consent,  Pre-op evaluation,  At surgeon's request and post-op pain management  Laterality: Right  Prep: chloraprep       Needles:  Injection technique: Single-shot  Needle Type: Stimiplex     Needle Length: 9cm  Needle Gauge: 21     Additional Needles:   Procedures:,,,, ultrasound used (permanent image in chart),,    Narrative:  Start time: 05/01/2022 8:35 AM End time: 05/01/2022 8:40 AM Injection made incrementally with aspirations every 5 mL.  Performed by: Personally  Anesthesiologist: Lynda Rainwater, MD

## 2022-05-01 NOTE — Transfer of Care (Signed)
Immediate Anesthesia Transfer of Care Note  Patient: Benjamin Barton  Procedure(s) Performed: Procedure(s) with comments: COMPUTER ASSISTED TOTAL KNEE ARTHROPLASTY (Right) - 160  Patient Location: PACU  Anesthesia Type:Spinal  Level of Consciousness: awake, alert  and oriented  Airway & Oxygen Therapy: Patient Spontanous Breathing  Post-op Assessment: Report given to RN and Post -op Vital signs reviewed and stable  Post vital signs: Reviewed and stable  Last Vitals:  Vitals:   05/01/22 0715 05/01/22 1134  BP: (!) 131/91 103/72  Pulse: 85 90  Resp: 18 10  Temp: 37.3 C 36.6 C  SpO2: [id number]    Complications: No apparent anesthesia complications

## 2022-05-01 NOTE — Op Note (Signed)
OPERATIVE REPORT  SURGEON: Rod Can, MD   ASSISTANT: Larene Pickett, PA-C  PREOPERATIVE DIAGNOSIS: Primary Right knee arthritis.   POSTOPERATIVE DIAGNOSIS: Primary Right knee arthritis.   PROCEDURE: Computer assisted Right total knee arthroplasty.   IMPLANTS: Zimmer Persona PPS Cementless CR femur, size 9. Persona 0 degree Spiked Keel OsseoTi Tibia, size H. Vivacit-E polyethelyene insert, size 11 mm, MC. TM standard patella, size 41 mm.  ANESTHESIA:  MAC, Regional, and Spinal  TOURNIQUET TIME: Not utilized.   ESTIMATED BLOOD LOSS:-400 mL    ANTIBIOTICS: 3 g Ancef.  DRAINS: None.  COMPLICATIONS: None   CONDITION: PACU - hemodynamically stable.   BRIEF CLINICAL NOTE: Benjamin Barton is a 47 y.o. male with a long-standing history of Right knee arthritis. After failing conservative management, the patient was indicated for total knee arthroplasty. The risks, benefits, and alternatives to the procedure were explained, and the patient elected to proceed.  PROCEDURE IN DETAIL: Adductor canal block was obtained in the pre-op holding area. Once inside the operative room, spinal anesthesia was obtained, and a foley catheter was inserted. The patient was then positioned and the lower extremity was prepped and draped in the normal sterile surgical fashion.  A time-out was called verifying side and site of surgery. The patient received IV antibiotics within 60 minutes of beginning the procedure. A tourniquet was not utilized.   An anterior approach to the knee was performed utilizing a midvastus arthrotomy. A medial release was performed and the patellar fat pad was excised. Stryker imageless navigation was used to cut the distal femur perpendicular to the mechanical axis. A freehand patellar resection was performed, and the patella was sized an prepared with a lug hole.  Nagivation was used to make a neutral proximal tibia resection, taking 9 mm of bone from the less affected lateral  side with 3 degrees of slope. The menisci were excised. A spacer block was placed, and the alignment and balance in extension were confirmed.   The distal femur was sized using the 3-degree external rotation guide referencing the posterior femoral cortex. The appropriate 4-in-1 cutting block was pinned into place. Rotation was checked using Whiteside's line, the epicondylar axis, and then confirmed with a spacer block in flexion. The remaining femoral cuts were performed, taking care to protect the MCL.  The tibia was sized and the trial tray was pinned into place. The remaining trail components were inserted. The knee was stable to varus and valgus stress through a full range of motion. The patella tracked centrally, and the PCL was well balanced. The trial components were removed, and the proximal tibial surface was prepared. Final components were impacted into place. The knee was tested for a final time and found to be well balanced.   The wound was copiously irrigated with Prontosan solution and normal saline using pulse lavage.  Marcaine solution was injected into the periarticular soft tissue.  The wound was closed in layers using #1 Vicryl and Stratafix for the fascia, 2-0 Vicryl for the subcutaneous fat, 2-0 Monocryl for the deep dermal layer, 3-0 running Monocryl subcuticular Stitch, and 4-0 Monocryl stay sutures at both ends of the wound. Dermabond was applied to the skin.  Once the glue was fully dried, an Aquacell Ag and compressive dressing were applied.  The patient was transported to the recovery room in stable condition.  Sponge, needle, and instrument counts were correct at the end of the case x2.  The patient tolerated the procedure well and there were no known complications.  Please note that a surgical assistant was a medical necessity for this procedure in order to perform it in a safe and expeditious manner. Surgical assistant was necessary to retract the ligaments and vital  neurovascular structures to prevent injury to them and also necessary for proper positioning of the limb to allow for anatomic placement of the prosthesis.

## 2022-05-01 NOTE — Interval H&P Note (Signed)
History and Physical Interval Note:  05/01/2022 7:26 AM  Benjamin Barton  has presented today for surgery, with the diagnosis of Right knee osteoarthritis.  The various methods of treatment have been discussed with the patient and family. After consideration of risks, benefits and other options for treatment, the patient has consented to  Procedure(s) with comments: [place] (Right) - 160 as a surgical intervention.  The patient's history has been reviewed, patient examined, no change in status, stable for surgery.  I have reviewed the patient's chart and labs.  Questions were answered to the patient's satisfaction.    The risks, benefits, and alternatives were discussed with the patient. There are risks associated with the surgery including, but not limited to, problems with anesthesia (death), infection, instability (giving out of the joint), dislocation, differences in leg length/angulation/rotation, fracture of bones, loosening or failure of implants, hematoma (blood accumulation) which may require surgical drainage, blood clots, pulmonary embolism, nerve injury (foot drop), and blood vessel injury. The patient understands these risks and elects to proceed.    Benjamin Barton

## 2022-05-02 ENCOUNTER — Encounter (HOSPITAL_COMMUNITY): Payer: Self-pay | Admitting: Orthopedic Surgery

## 2022-05-08 ENCOUNTER — Other Ambulatory Visit (HOSPITAL_COMMUNITY): Payer: Self-pay

## 2022-06-11 ENCOUNTER — Encounter (HOSPITAL_BASED_OUTPATIENT_CLINIC_OR_DEPARTMENT_OTHER): Payer: Self-pay

## 2022-06-11 ENCOUNTER — Emergency Department (HOSPITAL_BASED_OUTPATIENT_CLINIC_OR_DEPARTMENT_OTHER): Payer: BC Managed Care – PPO

## 2022-06-11 ENCOUNTER — Other Ambulatory Visit: Payer: Self-pay

## 2022-06-11 ENCOUNTER — Emergency Department (HOSPITAL_BASED_OUTPATIENT_CLINIC_OR_DEPARTMENT_OTHER)
Admission: EM | Admit: 2022-06-11 | Discharge: 2022-06-11 | Disposition: A | Discharge status: Home / Self Care | Payer: BC Managed Care – PPO | Attending: Emergency Medicine | Admitting: Emergency Medicine

## 2022-06-11 DIAGNOSIS — W270XXA Contact with workbench tool, initial encounter: Secondary | ICD-10-CM | POA: Diagnosis not present

## 2022-06-11 DIAGNOSIS — F17210 Nicotine dependence, cigarettes, uncomplicated: Secondary | ICD-10-CM | POA: Diagnosis not present

## 2022-06-11 DIAGNOSIS — I10 Essential (primary) hypertension: Secondary | ICD-10-CM | POA: Diagnosis not present

## 2022-06-11 DIAGNOSIS — E119 Type 2 diabetes mellitus without complications: Secondary | ICD-10-CM | POA: Diagnosis not present

## 2022-06-11 DIAGNOSIS — Z7984 Long term (current) use of oral hypoglycemic drugs: Secondary | ICD-10-CM | POA: Diagnosis not present

## 2022-06-11 DIAGNOSIS — S91331A Puncture wound without foreign body, right foot, initial encounter: Secondary | ICD-10-CM | POA: Insufficient documentation

## 2022-06-11 DIAGNOSIS — Z96651 Presence of right artificial knee joint: Secondary | ICD-10-CM | POA: Diagnosis not present

## 2022-06-11 DIAGNOSIS — Z7982 Long term (current) use of aspirin: Secondary | ICD-10-CM | POA: Diagnosis not present

## 2022-06-11 DIAGNOSIS — S99921A Unspecified injury of right foot, initial encounter: Secondary | ICD-10-CM | POA: Diagnosis present

## 2022-06-11 HISTORY — DX: Type 2 diabetes mellitus without complications: E11.9

## 2022-06-11 MED ORDER — IBUPROFEN 800 MG PO TABS
800.0000 mg | ORAL_TABLET | Freq: Once | ORAL | Status: AC
  Administered 2022-06-11: 800 mg via ORAL

## 2022-06-11 MED ORDER — IBUPROFEN 800 MG PO TABS: 800.0000 mg | ORAL_TABLET | Freq: Four times a day (QID) | ORAL | 0 refills | Status: AC | PRN

## 2022-06-11 MED ORDER — OXYCODONE HCL 5 MG PO TABS: 5.0000 mg | ORAL_TABLET | Freq: Four times a day (QID) | ORAL | 0 refills | Status: AC | PRN

## 2022-06-11 MED ORDER — ACETAMINOPHEN 325 MG PO TABS: 650.0000 mg | ORAL_TABLET | Freq: Four times a day (QID) | ORAL | 0 refills | Status: AC | PRN

## 2022-06-11 MED ORDER — ACETAMINOPHEN 325 MG PO TABS
650.0000 mg | ORAL_TABLET | Freq: Once | ORAL | Status: AC
  Administered 2022-06-11: 650 mg via ORAL

## 2022-06-11 MED ORDER — CIPROFLOXACIN HCL 500 MG PO TABS: 500.0000 mg | ORAL_TABLET | Freq: Two times a day (BID) | ORAL | 0 refills | Status: AC

## 2022-06-11 NOTE — ED Triage Notes (Signed)
Pt was in tool shed and got point by the sharp point of an axe like tool. Pt had tetanus in past 3 years. Pt 5 weeks post op total RT knee replacement.

## 2022-06-11 NOTE — ED Notes (Signed)
Patient's wound on right foot cleansed with anti-septic wound cleanser, applied bacitracin, and covered with band-aid.

## 2022-06-11 NOTE — ED Notes (Signed)
Given telfa, bacitracin and ace wrap to keep bandages in place, while healing. Pt verbalized understanding.

## 2022-06-11 NOTE — Discharge Instructions (Addendum)
It was a pleasure caring for you today in the emergency department.  Please return to the emergency department for any worsening or worrisome symptoms.  Please do not immerse your foot in water such as found in bathtubs or hot tubs, no use of swimming pools, no swimming in lakes or streams until the wound is completely healed.  Please complete oral antibiotics.  Please follow-up with your primary care doctor for recheck of the wound to ensure that it is healing appropriately.  If you notice significant redness, drainage, warmth to the wound in your foot, start having fever, please return to the ER for evaluation.

## 2022-06-11 NOTE — ED Provider Notes (Signed)
[place] [facility] DEPARTMENT AT [facility] Provider Note  CSN: [id number] Arrival date & time: 06/11/22 1903  Chief Complaint(s) Foot Injury  HPI Benjamin Barton is a 47 y.o. male with past medical history as below, significant for DM, GERD, plantar fasciitis, arthritis, s/p right total knee 3/24 who presents to the ED with complaint of foot injury.  Patient was using a pick ax type garden tool when he accidentally struck his right foot with the pick end.  He did clean the wound initially, having some difficulty walking secondary to the discomfort.  Tetanus is up-to-date per the patient.  Tool was relatively clean. No freshwater salt water exposure reported.  Tetanus <5 yrs ago per pt  Past Medical History Past Medical History:  Diagnosis Date   Arthritis    Diabetes mellitus without complication    GERD (gastroesophageal reflux disease)    Hypertension    Plantar fasciitis    both feet   Umbilical hernia    small, no surgery   Patient Active Problem List   Diagnosis Date Noted   S/P total knee arthroplasty, right 05/01/2022   Home Medication(s) Prior to Admission medications   Medication Sig Start Date End Date Taking? Authorizing Provider  acetaminophen (TYLENOL) 325 MG tablet Take 2 tablets (650 mg total) by mouth every 6 (six) hours as needed. 06/11/22  Yes Tanda Rockers A, DO  ciprofloxacin (CIPRO) 500 MG tablet Take 1 tablet (500 mg total) by mouth every 12 (twelve) hours for 7 days. 06/11/22 06/18/22 Yes Tanda Rockers A, DO  ibuprofen (ADVIL) 800 MG tablet Take 1 tablet (800 mg total) by mouth every 6 (six) hours as needed for up to 30 doses. 06/11/22  Yes Tanda Rockers A, DO  oxyCODONE (ROXICODONE) 5 MG immediate release tablet Take 1 tablet (5 mg total) by mouth every 6 (six) hours as needed for severe pain. 06/11/22  Yes Sloan Leiter, DO  aspirin (ASPIRIN CHILDRENS) 81 MG chewable tablet Chew 1 tablet (81 mg total) by mouth 2 (two) times daily with a meal.  05/01/22 06/15/22  Swinteck, Arlys John, MD  atorvastatin (LIPITOR) 20 MG tablet Take 20 mg by mouth every evening. 04/05/22   [provider]  diclofenac (VOLTAREN) 75 MG EC tablet Take 1 tablet (75 mg total) by mouth 2 (two) times daily with a meal. 05/01/22   Swinteck, Arlys John, MD  docusate sodium (COLACE) 100 MG capsule Take 1 capsule (100 mg total) by mouth 2 (two) times daily. 05/01/22 06/30/22  Swinteck, Arlys John, MD  metFORMIN (GLUCOPHAGE) 500 MG tablet Take 500 mg by mouth 2 (two) times daily with a meal. 04/05/22   [provider]  ondansetron (ZOFRAN) 4 MG tablet Take 1 tablet (4 mg total) by mouth every 8 (eight) hours as needed for nausea or vomiting. 05/01/22   Swinteck, Arlys John, MD  polyethylene glycol (MIRALAX) 17 g packet Take 17 g by mouth daily as needed for moderate constipation or severe constipation. 05/01/22   Swinteck, Arlys John, MD  senna (SENOKOT) 8.6 MG TABS tablet Take 2 tablets (17.2 mg total) by mouth at bedtime. 05/01/22 06/30/22  Samson Frederic, MD  Past Surgical History Past Surgical History:  Procedure Laterality Date   KNEE ARTHROPLASTY Right 05/01/2022   Procedure: COMPUTER ASSISTED TOTAL KNEE ARTHROPLASTY;  Surgeon: Samson Frederic, MD;  Location: [facility] ORS;  Service: Orthopedics;  Laterality: Right;  160   TESTICLE SURGERY     undescended testicle   Family History History reviewed. No pertinent family history.  Social History Social History   Tobacco Use   Smoking status: Some Days    Types: Cigarettes   Smokeless tobacco: Never  Vaping Use   Vaping Use: Never used  Substance Use Topics   Alcohol use: Not Currently   Drug use: Not Currently   Allergies Patient has no known allergies.  Review of Systems Review of Systems  Constitutional:  Negative for chills and fever.  HENT:  Negative for facial swelling and trouble swallowing.    Eyes:  Negative for photophobia and visual disturbance.  Respiratory:  Negative for cough and shortness of breath.   Cardiovascular:  Negative for chest pain and palpitations.  Gastrointestinal:  Negative for abdominal pain, nausea and vomiting.  Endocrine: Negative for polydipsia and polyuria.  Genitourinary:  Negative for difficulty urinating and hematuria.  Musculoskeletal:  Positive for arthralgias. Negative for gait problem and joint swelling.  Skin:  Positive for wound. Negative for pallor and rash.  Neurological:  Negative for syncope and headaches.  Psychiatric/Behavioral:  Negative for agitation and confusion.     Physical Exam Vital Signs  I have reviewed the triage vital signs BP (!) 133/90 (BP Location: Right Arm)   Pulse (!) 113   Temp 97.8 F (36.6 C)   Resp 18   Ht  (1.905 m)   Wt 120.2 kg   SpO2 96%   BMI 33.12 kg/m  Physical Exam Vitals and nursing note reviewed.  Constitutional:      General: He is not in acute distress.    Appearance: He is well-developed.  HENT:     Head: Normocephalic and atraumatic.     Right Ear: External ear normal.     Left Ear: External ear normal.     Mouth/Throat:     Mouth: Mucous membranes are moist.  Eyes:     General: No scleral icterus. Cardiovascular:     Rate and Rhythm: Normal rate.     Pulses: Normal pulses.  Pulmonary:     Effort: Pulmonary effort is normal. No respiratory distress.     Breath sounds: No stridor.  Abdominal:     General: Abdomen is flat. There is no distension.     Palpations: Abdomen is soft.  Musculoskeletal:     Right lower leg: No edema.     Left lower leg: No edema.       Feet:  Feet:     Comments: Capillary refill is brisk to the toes.  DP pulses 2+ bilateral.  Achilles tendon intact.  Lower extremities are NVI. Skin:    General: Skin is warm and dry.     Capillary Refill: Capillary refill takes less than 2 seconds.  Neurological:     Mental Status: He is alert and oriented  to person, place, and time.     GCS: GCS eye subscore is 4. GCS verbal subscore is 5. GCS motor subscore is 6.  Psychiatric:        Mood and Affect: Mood normal.        Behavior: Behavior normal.     ED Results and Treatments Labs (all labs ordered are listed, but only  abnormal results are displayed) Labs Reviewed - No data to display                                                                                                                        Radiology DG Foot Complete Right  Result Date: 06/11/2022 CLINICAL DATA:  Tool fell on right foot and cut patent. Status post right knee arthroplasty 5 weeks ago. Trauma. EXAM: RIGHT FOOT COMPLETE - 3+ VIEW COMPARISON:  None Available. FINDINGS: Mild hallux valgus. Minimal lateral great toe metatarsophalangeal joint space narrowing. Mild to moderate medial great toe interphalangeal joint space narrowing and peripheral osteophytosis. Moderate plantar calcaneal heel spur with additional mineralization along an approximate 2.5 cm length of the proximal plantar fascia. Minimal chronic enthesopathic change at the Achilles insertion on the calcaneus. Mild dorsal midfoot degenerative osteophytosis. No acute fracture is seen.  No dislocation. There is irregularity of the medial midfoot soft tissues, medial to the medial cuneiform, presumably soft tissue injury. IMPRESSION: 1. No acute fracture.  Soft tissue injury of the medial midfoot. 2. Mild hallux valgus. 3. Great toe minimal metatarsophalangeal and mild interphalangeal osteoarthritis. 4. Moderate plantar calcaneal heel spur with additional chronic mineralization along an approximate 2.5 cm length of the proximal plantar fascia. Electronically Signed   By: Neita Garnet M.D.   On: 06/11/2022 19:56    Pertinent labs & imaging results that were available during my care of the patient were reviewed by me and considered in my medical decision making (see MDM for details).  Medications Ordered in  ED Medications  ibuprofen (ADVIL) tablet 800 mg (800 mg Oral Given 06/11/22 2223)  acetaminophen (TYLENOL) tablet 650 mg (650 mg Oral Given 06/11/22 2223)                                                                                                                                     Procedures Procedures  (including critical care time)  Medical Decision Making / ED Course    Medical Decision Making:    Mehki Klumpp is a 47 y.o. male with past medical history as below, significant for DM, GERD, plantar fasciitis, arthritis, s/p right total knee 3/24 who presents to the ED with complaint of foot injury.. The complaint involves an extensive differential diagnosis and also carries with it a high risk of complications and morbidity.  Serious etiology was considered. Ddx includes but is not limited  to: Fracture, soft tissue injury, ligamentous injury, vascular injury, foreign body, etc.  Complete initial physical exam performed, notably the patient  was no acute distress, resting comfortably, no active bleeding appreciated.    Reviewed and confirmed nursing documentation for past medical history, family history, social history.  Vital signs reviewed.     X-ray reviewed, no evidence of fracture or retained foreign body.  Wound was cleaned/irrigated by nursing at bedside.  Dressing applied and postop shoe given.  Start patient on oral Cipro.  Oral analgesics for home.  Discussed return precautions in regards to his wound, discussed s/s infection w/ pt and family at bedside   The patient improved significantly and was discharged in stable condition. Detailed discussions were had with the patient regarding current findings, and need for close f/u with PCP or on call doctor. The patient has been instructed to return immediately if the symptoms worsen in any way for re-evaluation. Patient verbalized understanding and is in agreement with current care plan. All questions answered prior to  discharge.       Additional history obtained: -Additional history obtained from family -External records from outside source obtained and reviewed including: Chart review including previous notes, labs, imaging, consultation notes including home medications, PDMP, prior admission, prior surgical evaluation, primary care documentation status post right total knee 3/6 Dr. Linna Caprice  Lab Tests: na  EKG   EKG Interpretation  Date/Time:    Ventricular Rate:    PR Interval:    QRS Duration:   QT Interval:    QTC Calculation:   R Axis:     Text Interpretation:           Imaging Studies ordered: I ordered imaging studies including foot xr I independently visualized the following imaging with scope of interpretation limited to determining acute life threatening conditions related to emergency care; findings noted above, significant for no fx I independently visualized and interpreted imaging. I agree with the radiologist interpretation   Medicines ordered and prescription drug management: Meds ordered this encounter  Medications   ibuprofen (ADVIL) tablet 800 mg   acetaminophen (TYLENOL) tablet 650 mg   ibuprofen (ADVIL) 800 MG tablet    Sig: Take 1 tablet (800 mg total) by mouth every 6 (six) hours as needed for up to 30 doses.    Dispense:  30 tablet    Refill:  0   acetaminophen (TYLENOL) 325 MG tablet    Sig: Take 2 tablets (650 mg total) by mouth every 6 (six) hours as needed.    Dispense:  36 tablet    Refill:  0   ciprofloxacin (CIPRO) 500 MG tablet    Sig: Take 1 tablet (500 mg total) by mouth every 12 (twelve) hours for 7 days.    Dispense:  14 tablet    Refill:  0   oxyCODONE (ROXICODONE) 5 MG immediate release tablet    Sig: Take 1 tablet (5 mg total) by mouth every 6 (six) hours as needed for severe pain.    Dispense:  5 tablet    Refill:  0    -I have reviewed the patients home medicines and have made adjustments as needed   Consultations  Obtained: na   Cardiac Monitoring: The patient was maintained on a cardiac monitor.  I personally viewed and interpreted the cardiac monitored which showed an underlying rhythm of: NA  Social Determinants of Health:  Diagnosis or treatment significantly limited by social determinants of health: current smoker and obesity Counseled patient for  approximately 1 minutes regarding smoking cessation. Discussed risks of smoking and how they applied and affected their visit here today. Patient not ready to quit at this time, however will follow up with their primary doctor when they are.   CPT code: 16109: intermediate counseling for smoking cessation     Reevaluation: After the interventions noted above, I reevaluated the patient and found that they have improved  Co morbidities that complicate the patient evaluation  Past Medical History:  Diagnosis Date   Arthritis    Diabetes mellitus without complication    GERD (gastroesophageal reflux disease)    Hypertension    Plantar fasciitis    both feet   Umbilical hernia    small, no surgery      Dispostion: Disposition decision including need for hospitalization was considered, and patient discharged from emergency department.    Final Clinical Impression(s) / ED Diagnoses Final diagnoses:  Puncture wound of right foot, initial encounter     This chart was dictated using voice recognition software.  Despite best efforts to proofread,  errors can occur which can change the documentation meaning.    Sloan Leiter, DO 06/11/22 2311

## 2022-06-25 ENCOUNTER — Other Ambulatory Visit (HOSPITAL_COMMUNITY): Payer: Self-pay

## 2022-11-29 ENCOUNTER — Other Ambulatory Visit (HOSPITAL_COMMUNITY): Payer: Self-pay | Admitting: Medical

## 2022-11-29 ENCOUNTER — Ambulatory Visit (HOSPITAL_COMMUNITY)
Admission: RE | Admit: 2022-11-29 | Discharge: 2022-11-29 | Disposition: A | Discharge status: Home / Self Care | Payer: BC Managed Care – PPO | Source: Ambulatory Visit | Attending: Vascular Surgery

## 2022-11-29 DIAGNOSIS — M79661 Pain in right lower leg: Secondary | ICD-10-CM

## 2022-11-29 DIAGNOSIS — M79669 Pain in unspecified lower leg: Secondary | ICD-10-CM | POA: Insufficient documentation
